# Patient Record
Sex: Female | Born: 1960 | Race: White | Hispanic: No | Marital: Married | Smoking: Never smoker
Health system: Southern US, Community
[De-identification: ages and names within clinical notes are randomized; demographics above are authoritative.]

## PROBLEM LIST (undated history)

## (undated) DIAGNOSIS — R569 Unspecified convulsions: Secondary | ICD-10-CM

## (undated) DIAGNOSIS — F329 Major depressive disorder, single episode, unspecified: Secondary | ICD-10-CM

## (undated) DIAGNOSIS — F32A Depression, unspecified: Secondary | ICD-10-CM

## (undated) DIAGNOSIS — E7800 Pure hypercholesterolemia, unspecified: Secondary | ICD-10-CM

## (undated) DIAGNOSIS — D649 Anemia, unspecified: Secondary | ICD-10-CM

## (undated) DIAGNOSIS — F419 Anxiety disorder, unspecified: Secondary | ICD-10-CM

## (undated) DIAGNOSIS — M1990 Unspecified osteoarthritis, unspecified site: Secondary | ICD-10-CM

## (undated) HISTORY — DX: Depression, unspecified: F32.A

## (undated) HISTORY — DX: Pure hypercholesterolemia, unspecified: E78.00

## (undated) HISTORY — DX: Unspecified osteoarthritis, unspecified site: M19.90

## (undated) HISTORY — DX: Anxiety disorder, unspecified: F41.9

## (undated) HISTORY — DX: Anemia, unspecified: D64.9

## (undated) HISTORY — DX: Major depressive disorder, single episode, unspecified: F32.9

## (undated) HISTORY — DX: Unspecified convulsions: R56.9

## (undated) HISTORY — PX: DILATION AND CURETTAGE OF UTERUS: SHX78

---

## 1999-06-18 ENCOUNTER — Other Ambulatory Visit: Admission: RE | Admit: 1999-06-18 | Discharge: 1999-06-18 | Payer: Self-pay | Admitting: *Deleted

## 1999-08-06 ENCOUNTER — Ambulatory Visit (HOSPITAL_COMMUNITY): Admission: RE | Admit: 1999-08-06 | Discharge: 1999-08-06 | Payer: Self-pay | Admitting: Obstetrics and Gynecology

## 1999-08-06 ENCOUNTER — Encounter: Payer: Self-pay | Admitting: Obstetrics and Gynecology

## 1999-09-10 ENCOUNTER — Ambulatory Visit (HOSPITAL_COMMUNITY): Admission: RE | Admit: 1999-09-10 | Discharge: 1999-09-10 | Payer: Self-pay | Admitting: Obstetrics and Gynecology

## 1999-09-10 ENCOUNTER — Encounter: Payer: Self-pay | Admitting: Obstetrics and Gynecology

## 2000-01-12 ENCOUNTER — Inpatient Hospital Stay (HOSPITAL_COMMUNITY): Admission: AD | Admit: 2000-01-12 | Discharge: 2000-01-14 | Payer: Self-pay | Admitting: Obstetrics and Gynecology

## 2002-07-11 ENCOUNTER — Other Ambulatory Visit: Admission: RE | Admit: 2002-07-11 | Discharge: 2002-07-11 | Payer: Self-pay | Admitting: Family Medicine

## 2003-10-31 ENCOUNTER — Other Ambulatory Visit: Admission: RE | Admit: 2003-10-31 | Discharge: 2003-10-31 | Payer: Self-pay | Admitting: Family Medicine

## 2004-08-06 ENCOUNTER — Ambulatory Visit: Payer: Self-pay | Admitting: Family Medicine

## 2004-10-19 ENCOUNTER — Ambulatory Visit: Payer: Self-pay | Admitting: Family Medicine

## 2004-11-03 ENCOUNTER — Encounter: Admission: RE | Admit: 2004-11-03 | Discharge: 2004-11-03 | Payer: Self-pay | Admitting: Family Medicine

## 2004-11-22 ENCOUNTER — Encounter: Admission: RE | Admit: 2004-11-22 | Discharge: 2004-11-22 | Payer: Self-pay | Admitting: Family Medicine

## 2004-12-24 ENCOUNTER — Ambulatory Visit: Payer: Self-pay | Admitting: Family Medicine

## 2006-01-18 ENCOUNTER — Encounter: Admission: RE | Admit: 2006-01-18 | Discharge: 2006-01-18 | Payer: Self-pay | Admitting: Family Medicine

## 2006-06-28 ENCOUNTER — Other Ambulatory Visit: Admission: RE | Admit: 2006-06-28 | Discharge: 2006-06-28 | Payer: Self-pay | Admitting: Family Medicine

## 2006-06-28 ENCOUNTER — Encounter: Payer: Self-pay | Admitting: Family Medicine

## 2006-06-28 ENCOUNTER — Ambulatory Visit: Payer: Self-pay | Admitting: Family Medicine

## 2007-02-28 ENCOUNTER — Encounter: Payer: Self-pay | Admitting: Obstetrics & Gynecology

## 2007-02-28 ENCOUNTER — Ambulatory Visit: Payer: Self-pay | Admitting: Obstetrics & Gynecology

## 2007-03-12 ENCOUNTER — Ambulatory Visit: Payer: Self-pay | Admitting: Obstetrics & Gynecology

## 2007-03-16 ENCOUNTER — Encounter: Admission: RE | Admit: 2007-03-16 | Discharge: 2007-03-16 | Payer: Self-pay | Admitting: Obstetrics & Gynecology

## 2007-04-04 ENCOUNTER — Ambulatory Visit: Payer: Self-pay | Admitting: Obstetrics & Gynecology

## 2007-04-27 ENCOUNTER — Ambulatory Visit: Payer: Self-pay | Admitting: Family Medicine

## 2007-04-27 DIAGNOSIS — F3289 Other specified depressive episodes: Secondary | ICD-10-CM | POA: Insufficient documentation

## 2007-04-27 DIAGNOSIS — F909 Attention-deficit hyperactivity disorder, unspecified type: Secondary | ICD-10-CM | POA: Insufficient documentation

## 2007-04-27 DIAGNOSIS — E785 Hyperlipidemia, unspecified: Secondary | ICD-10-CM

## 2007-04-27 DIAGNOSIS — N6019 Diffuse cystic mastopathy of unspecified breast: Secondary | ICD-10-CM

## 2007-04-27 DIAGNOSIS — F329 Major depressive disorder, single episode, unspecified: Secondary | ICD-10-CM | POA: Insufficient documentation

## 2007-04-28 ENCOUNTER — Encounter: Payer: Self-pay | Admitting: Family Medicine

## 2007-04-29 LAB — CONVERTED CEMR LAB: Chlamydia, DNA Probe: NEGATIVE

## 2007-04-30 LAB — CONVERTED CEMR LAB

## 2007-10-03 ENCOUNTER — Ambulatory Visit: Payer: Self-pay | Admitting: Gynecology

## 2007-10-09 ENCOUNTER — Ambulatory Visit: Payer: Self-pay | Admitting: Obstetrics & Gynecology

## 2007-10-25 ENCOUNTER — Ambulatory Visit: Payer: Self-pay | Admitting: Obstetrics & Gynecology

## 2008-01-03 ENCOUNTER — Ambulatory Visit: Payer: Self-pay | Admitting: Family Medicine

## 2008-01-04 LAB — CONVERTED CEMR LAB: ALT: 35 units/L (ref 0–35)

## 2008-04-28 ENCOUNTER — Encounter: Admission: RE | Admit: 2008-04-28 | Discharge: 2008-04-28 | Payer: Self-pay | Admitting: Family Medicine

## 2008-05-17 ENCOUNTER — Encounter: Payer: Self-pay | Admitting: Family Medicine

## 2008-06-03 ENCOUNTER — Ambulatory Visit: Payer: Self-pay | Admitting: Obstetrics & Gynecology

## 2008-06-03 ENCOUNTER — Encounter: Payer: Self-pay | Admitting: Obstetrics & Gynecology

## 2008-06-09 ENCOUNTER — Encounter: Admission: RE | Admit: 2008-06-09 | Discharge: 2008-06-09 | Payer: Self-pay | Admitting: Family Medicine

## 2008-06-24 ENCOUNTER — Encounter: Payer: Self-pay | Admitting: Family Medicine

## 2008-07-09 ENCOUNTER — Ambulatory Visit: Payer: Self-pay | Admitting: Family Medicine

## 2008-07-24 ENCOUNTER — Ambulatory Visit: Payer: Self-pay | Admitting: Obstetrics & Gynecology

## 2008-09-05 DIAGNOSIS — R569 Unspecified convulsions: Secondary | ICD-10-CM

## 2008-09-05 HISTORY — DX: Unspecified convulsions: R56.9

## 2008-11-12 ENCOUNTER — Ambulatory Visit: Payer: Self-pay | Admitting: Family Medicine

## 2008-11-12 DIAGNOSIS — R5381 Other malaise: Secondary | ICD-10-CM | POA: Insufficient documentation

## 2008-11-12 DIAGNOSIS — R5383 Other fatigue: Secondary | ICD-10-CM

## 2009-01-06 ENCOUNTER — Ambulatory Visit: Payer: Self-pay | Admitting: Family Medicine

## 2009-01-07 LAB — CONVERTED CEMR LAB
ALT: 28 units/L (ref 0–35)
Cholesterol: 235 mg/dL — ABNORMAL HIGH (ref 0–200)
Direct LDL: 161.9 mg/dL
VLDL: 11.2 mg/dL (ref 0.0–40.0)

## 2009-01-28 ENCOUNTER — Encounter: Payer: Self-pay | Admitting: Family Medicine

## 2009-05-13 ENCOUNTER — Ambulatory Visit: Payer: Self-pay | Admitting: Obstetrics & Gynecology

## 2009-05-13 ENCOUNTER — Encounter: Payer: Self-pay | Admitting: Obstetrics & Gynecology

## 2009-05-13 LAB — CONVERTED CEMR LAB

## 2009-05-14 ENCOUNTER — Encounter: Payer: Self-pay | Admitting: Obstetrics & Gynecology

## 2009-05-14 LAB — CONVERTED CEMR LAB: Clue Cells Wet Prep HPF POC: NONE SEEN

## 2009-05-22 ENCOUNTER — Ambulatory Visit (HOSPITAL_COMMUNITY): Admission: RE | Admit: 2009-05-22 | Discharge: 2009-05-22 | Payer: Self-pay | Admitting: Obstetrics & Gynecology

## 2009-05-29 ENCOUNTER — Ambulatory Visit (HOSPITAL_COMMUNITY): Admission: RE | Admit: 2009-05-29 | Discharge: 2009-05-29 | Payer: Self-pay | Admitting: Family Medicine

## 2009-08-11 ENCOUNTER — Ambulatory Visit: Payer: Self-pay | Admitting: Obstetrics & Gynecology

## 2009-08-11 ENCOUNTER — Encounter: Payer: Self-pay | Admitting: Family Medicine

## 2009-08-12 ENCOUNTER — Encounter: Payer: Self-pay | Admitting: Obstetrics & Gynecology

## 2009-09-03 ENCOUNTER — Ambulatory Visit: Payer: Self-pay | Admitting: Obstetrics & Gynecology

## 2009-10-05 ENCOUNTER — Ambulatory Visit: Payer: Self-pay | Admitting: Obstetrics and Gynecology

## 2009-10-06 ENCOUNTER — Encounter: Payer: Self-pay | Admitting: Obstetrics & Gynecology

## 2009-10-27 ENCOUNTER — Telehealth: Payer: Self-pay | Admitting: Family Medicine

## 2009-10-30 ENCOUNTER — Ambulatory Visit: Payer: Self-pay | Admitting: Family Medicine

## 2009-10-31 ENCOUNTER — Encounter: Payer: Self-pay | Admitting: Family Medicine

## 2009-11-02 LAB — CONVERTED CEMR LAB
ALT: 48 units/L — ABNORMAL HIGH (ref 0–35)
AST: 35 units/L (ref 0–37)
Albumin: 4 g/dL (ref 3.5–5.2)
Alkaline Phosphatase: 61 units/L (ref 39–117)
CO2: 27 meq/L (ref 19–32)
Calcium: 9.6 mg/dL (ref 8.4–10.5)
Chloride: 106 meq/L (ref 96–112)
Cholesterol: 247 mg/dL — ABNORMAL HIGH (ref 0–200)
Creatinine, Ser: 0.9 mg/dL (ref 0.4–1.2)
Direct LDL: 172.5 mg/dL
Eosinophils Absolute: 0.2 10*3/uL (ref 0.0–0.7)
Eosinophils Relative: 4 % (ref 0.0–5.0)
GFR calc non Af Amer: 71.28 mL/min (ref >60)
Glucose, Bld: 92 mg/dL (ref 70–99)
Monocytes Relative: 10.7 % (ref 3.0–12.0)
Neutrophils Relative %: 49.9 % (ref 43.0–77.0)
Potassium: 4 meq/L (ref 3.5–5.1)
RBC: 4.06 M/uL (ref 3.87–5.11)
Sodium: 137 meq/L (ref 135–145)
TSH: 3.02 microintl units/mL (ref 0.35–5.50)
Total Protein: 7.2 g/dL (ref 6.0–8.3)
VLDL: 15.4 mg/dL (ref 0.0–40.0)
Vit D, 25-Hydroxy: 45 ng/mL (ref 30–89)
WBC: 4.7 10*3/uL (ref 4.5–10.5)

## 2009-11-03 ENCOUNTER — Ambulatory Visit: Payer: Self-pay | Admitting: Obstetrics and Gynecology

## 2009-11-27 ENCOUNTER — Encounter: Payer: Self-pay | Admitting: Family Medicine

## 2009-12-31 ENCOUNTER — Ambulatory Visit: Payer: Self-pay | Admitting: Family Medicine

## 2009-12-31 DIAGNOSIS — T2200XA Burn of unspecified degree of shoulder and upper limb, except wrist and hand, unspecified site, initial encounter: Secondary | ICD-10-CM | POA: Insufficient documentation

## 2010-02-16 ENCOUNTER — Ambulatory Visit: Payer: Self-pay | Admitting: Family Medicine

## 2010-02-17 LAB — CONVERTED CEMR LAB
AST: 31 units/L (ref 0–37)
Cholesterol: 172 mg/dL (ref 0–200)
HDL: 58.7 mg/dL (ref >39.00)
LDL Cholesterol: 103 mg/dL — ABNORMAL HIGH (ref 0–99)
Triglycerides: 52 mg/dL (ref 0.0–149.0)
VLDL: 10.4 mg/dL (ref 0.0–40.0)

## 2010-05-26 ENCOUNTER — Encounter (INDEPENDENT_AMBULATORY_CARE_PROVIDER_SITE_OTHER): Payer: Self-pay | Admitting: *Deleted

## 2010-06-09 ENCOUNTER — Ambulatory Visit: Payer: Self-pay | Admitting: Family Medicine

## 2010-06-09 ENCOUNTER — Encounter: Payer: Self-pay | Admitting: Family Medicine

## 2010-06-09 ENCOUNTER — Telehealth: Payer: Self-pay | Admitting: Family Medicine

## 2010-06-11 LAB — CONVERTED CEMR LAB
ALT: 19 units/L (ref 0–35)
AST: 23 units/L (ref 0–37)
Cholesterol: 179 mg/dL (ref 0–200)
VLDL: 12.2 mg/dL (ref 0.0–40.0)

## 2010-08-31 ENCOUNTER — Ambulatory Visit
Admission: RE | Admit: 2010-08-31 | Discharge: 2010-08-31 | Payer: Self-pay | Source: Home / Self Care | Attending: Obstetrics & Gynecology | Admitting: Obstetrics & Gynecology

## 2010-09-07 ENCOUNTER — Ambulatory Visit
Admission: RE | Admit: 2010-09-07 | Discharge: 2010-09-07 | Payer: Self-pay | Source: Home / Self Care | Attending: Family Medicine | Admitting: Family Medicine

## 2010-09-08 ENCOUNTER — Encounter: Payer: Self-pay | Admitting: Obstetrics & Gynecology

## 2010-10-05 NOTE — Miscellaneous (Signed)
Summary: PPD  Clinical Lists Changes  Orders: Added new Service order of TB Skin Test [phone]) - Signed Added new Service order of Admin 1st Vaccine (60454) - Signed Observations: Added new observation of TB PPDRESULT: negative (06/09/2010 9:38) Added new observation of PPD RESULT: < 5mm (06/09/2010 9:38) Added new observation of TB-PPD RDDTE: 06/11/2010 (06/09/2010 9:38) Added new observation of TB-PPD LOT#: [id number] (06/09/2010 9:38) Added new observation of TB-PPD EXP: 07/08/2011 (06/09/2010 9:38) Added new observation of TB-PPD BY: Lowella Petties CMA (06/09/2010 9:38) Added new observation of TB-PPD RTE: ID (06/09/2010 9:38) Added new observation of TB-PPD DSE: 0.1 ml (06/09/2010 9:38) Added new observation of TB-PPD MFR: Sanofi Pasteur (06/09/2010 9:38) Added new observation of TB-PPD SITE: right forearm (06/09/2010 9:38) Added new observation of TB-PPD: PPD (06/09/2010 9:38)      Immunizations Administered:  PPD Skin Test:    Vaccine Type: PPD    Site: right forearm    Mfr: Sanofi Pasteur    Dose: 0.1 ml    Route: ID    Given by: Lowella Petties CMA    Exp. Date: 07/08/2011    Lot #: [id number]  PPD Results    Date of reading: 06/11/2010    Results: < 5mm    Interpretation: negative The patient presented after 48 hours to check the injection site for positive or negative reaction. Injection site esamination: No firm bump forms at the test site. Slightly reddish appearance and diameter was smaller than 5mm. Assessment & Plan: Negative TB skin test. Patient was counseled to callif experience any irritation of site.  Nurse signature Lewanda Rife LPN  June 11, 2010 11:32 AM   Physician signature

## 2010-10-05 NOTE — Progress Notes (Signed)
Summary: pt wants labs prior to physical  Phone Note Call from Patient Call back at Home Phone [phone]   Caller: Patient Call For: Judith Part MD Summary of Call: Pt. has a physical scheduled for april but she is asking if she can come in prior to that for labs.  Please advise. Initial call taken by: Lowella Petties CMA,  October 27, 2009 3:32 PM  Follow-up for Phone Call        I would love to do that fasting labs for lipid/ wellness and vit D please v70.0, 272 - thanks Follow-up by: Judith Part MD,  October 27, 2009 4:10 PM  Additional Follow-up for Phone Call Additional follow up Details #1::        Pt request lab to be done soon. Fasting lab appointment scheduled as instructed 10/30/09 at 8:45am.[facility] LPN  October 27, 2009 4:42 PM
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
## 2010-10-05 NOTE — Letter (Signed)
Summary: [place] No Show Letter  [place] at [facility]  [address] [place], [place] [address]   Phone: [phone]  Fax: [phone]    05/26/2010 MRN: [id number]  Alison Kelley [address] [place], [place]  [address]   Dear Ms. Vanosdol,   Our records indicate that you missed your scheduled appointment with _____lab________________ on __9.21.11__________.  Please contact this office to reschedule your appointment as soon as possible.  It is important that you keep your scheduled appointments with your physician, so we can provide you the best care possible.  Please be advised that there may be a charge for "no show" appointments.    Sincerely,   [place] at [facility]

## 2010-10-05 NOTE — Consult Note (Signed)
Summary: [facility]  [facility]   Imported By: Lanelle Bal 12/05/2009 09:33:40  _____________________________________________________________________  External Attachment:    Type:   Image     Comment:   External Document

## 2010-10-05 NOTE — Progress Notes (Signed)
Summary: form for employment  Phone Note Call from Patient   Caller: Patient Call For: Judith Part MD Summary of Call: Pt. has dropped off form for employment to be completed.  Form is on your shelf. 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 and there are no restrictions with teaching or driving due to depression or seizures. Contact # for pt when form is ready [phone]. Lewanda Rife LPN  June 11, 2010 11:30 AM     Additional Follow-up for Phone Call Additional follow up Details #2::    form done and in nurse in box  Follow-up by: Judith Part MD,  June 11, 2010 11:41 AM  Additional Follow-up for Phone Call Additional follow up Details #3:: Details for Additional Follow-up Action Taken: Patient notified as instructed by telephone. Pt said she would pick up Mon afternoon. form copied and sent for scanning. form at the front desk for pick up.Lewanda Rife LPN  June 11, 2010 3:14 PM

## 2010-11-22 ENCOUNTER — Other Ambulatory Visit: Payer: Self-pay

## 2010-11-22 DIAGNOSIS — N951 Menopausal and female climacteric states: Secondary | ICD-10-CM

## 2010-12-23 ENCOUNTER — Ambulatory Visit: Payer: Self-pay | Admitting: Obstetrics & Gynecology
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
## 2011-01-18 NOTE — Assessment & Plan Note (Signed)
NAME:  Alison Kelley, Alison Kelley            ACCOUNT NO.:  [id number]   MEDICAL RECORD NO.:  [id number]          PATIENT TYPE:  POB   LOCATION:  [facility]         FACILITY:  [facility]   PHYSICIAN:  Catalina Antigua, MD     DATE OF BIRTH:  11-07-1960   DATE OF SERVICE:  11/03/2009                                  CLINIC NOTE   This is a 50 year old gravida 2, para 2 who presents today for annual  exam.  The patient is currently without any complaints.  Denies any  abnormal bleeding or discharge.  The patient is in a monogamous  relation, is using Mirena IUD for birth control.  The patient also  reports having 2 urinary tract infections in the fall, one of which  caused her to have high fevers resulting in a febrile seizure.  The  patient had a full workup by a neurologist and that workup was found to  be negative.   PAST MEDICAL HISTORY:  Significant for depression, uterine fibroids, and  vitiligo.   PAST SURGICAL HISTORY:  She has had a D&C and C-section.   PAST GYNECOLOGICAL HISTORY:  She has had an abnormal Pap smear in the  past followed by colposcopy and has been normal since.   PAST OBSTETRICAL HISTORY:  She has had a C-section.   FAMILY HISTORY:  Significant for a grandfather with diabetes and a  grandmother with heart disease.  Her mother has high blood pressure and  was diagnosed which uterine cancer.  She has a sister who was diagnosed  with breast cancer at the age of 67 and her father is deceased from  Hodgkin's disease.   SOCIAL HISTORY:  She denies drinking, smoking, or the use of illicit  drugs.   PHYSICAL EXAMINATION:  VITAL SIGNS:  Her blood pressure is 87/57, pulse  of 65, weight of 151 pounds, height of 5 feet 7 inches.  LUNGS:  Clear to auscultation bilaterally.  HEART:  Regular rate and rhythm.  BREASTS:  Equal in size.  No skin dimpling.  No expressible nipple  discharge.  No palpable masses or lymphadenopathy.  ABDOMEN:  Soft, nontender, nondistended.  GENITALIA:  Normal vaginal mucosa and normal cervix.  IUD strings were  visualized at the external os.  No abnormal bleeding or discharge.  Bimanual exam shows small anteverted uterus.  No palpable adnexal masses  or tenderness.   ASSESSMENT/PLAN:  A 50 year old gravida 2, para 2 who is here for her  annual exam.  The patient is currently without complaints.  Pap smear  was performed.  The patient was provided with a referral for mammogram  in the  fall.  The patient will be contacted with any abnormal results.  The  patient is to return in a year or p.r.n.  Her IUD is due to be removed  in 2013.           ______________________________  Catalina Antigua, MD     PC/MEDQ  D:  11/03/2009  T:  11/04/2009  Job:  [id number]

## 2011-01-18 NOTE — Assessment & Plan Note (Signed)
NAME:  Alison Kelley, Alison Kelley NO.:  [id number]   MEDICAL RECORD NO.:  [id number]          PATIENT TYPE:  POB   LOCATION:  [facility]         FACILITY:  [facility]   PHYSICIAN:  Allie Bossier, MD        DATE OF BIRTH:  02-20-1961   DATE OF SERVICE:  05/13/2009                                  CLINIC NOTE   Alison Kelley is a 50 year old divorced white gravida 2, para 2, who comes in  here for her annual exam.  She was actually due for a followup Pap smear  4 months ago due to her history of low-grade dysplasia, however, she has  just made it today.  She complains of recurrent yeast infections these  have never been diagnosed microscopically these are self-diagnosis.  She  says that her partner occasionally gets an itch on his penis and that  they both use a yeast cream and it seems to make it better.   PAST MEDICAL HISTORY:  Depression, fibroids, vitiligo, and history of  low-grade dysplasia.   REVIEW OF SYSTEMS:  She is now divorced and consider in getting married  next year.  She is a Optician, dispensing.  She has been monogamous for the last 2  years.  The remainder of review of systems questions are negative.   PREVIOUS SURGERIES:  She has had a D and C for a spontaneous AB and a C-  section in 1997.  She had a VBAC following that.   ALLERGIES:  No known drug allergies.  No to LATEX allergies.   SOCIAL HISTORY:  She drinks alcohol rarely.   FAMILY HISTORY:  Her sister was diagnosed with breast cancer at age 87.  Her mother had uterine cancer diagnosed at age 29.  Her father has  Hodgkin's disease.  There is no family history of colon cancer.  She has a Mirena IUD in place.   MEDICATIONS:  1. She takes vitamin D.  2. Pristiq daily.  3. Prozac 80 mg daily.  4. Fish oil daily.  5. Multivitamin daily.  6. Excedrin as necessary.   PHYSICAL EXAMINATION:  VITAL SIGNS:  Height 5 feet 7 inches, weight 144,  blood pressure 103/73, pulse 66.  HEENT:  Normal breast exam normal  bilaterally.  HEART:  Regular rate and rhythm.  LUNGS:  Clear to auscultation bilaterally.  ABDOMEN:  Benign scaphoid.  EXTERNAL GENITALIA:  No lesions.  Cervix appears normal, discharge  appears normal as well, but I did do a wet prep and a Pap smear.   ASSESSMENT AND PLAN:  1. Annual exam.  I have checked a wet prep and scheduled a mammogram.      I recommended self-breast exams monthly.  2. Vaginal irritation.  The patient and her partner has genital      irritation.  I have checked a wet prep and I did an HSV-II, IgG,      blood test, and per request, I have also checked HIV, GC, and      Chlamydia.  We will treat these as indicated.      Allie Bossier, MD     MCD/MEDQ  D:  05/13/2009  T:  05/13/2009  Job:  [id number]

## 2011-01-21 NOTE — Op Note (Signed)
[facility]  Patient:    Alison Kelley, Alison Kelley                   MRN: [id number] Proc. Date: 01/12/00 Adm. Date:  [id number] Attending:  Dierdre Forth Pearline                           Operative Report  PREOPERATIVE DIAGNOSIS:       Intrauterine pregnancy at term.  Prior cesarean section with desire for a trial of labor.  Meconium stained fluid.  Persistent prolonged variable decellerations during the second stage.  POSTOPERATIVE DIAGNOSIS:      Intrauterine pregnancy at term.  Prior cesarean section with desire for a trial of labor.  Meconium stained fluid.  Persistent prolonged variable decellerations during the second stage.  Plus nuchal cord x .  OPERATION:                    Vacuum-assisted vaginal delivery, midline episiotomy with repair.  SURGEON:                      Vanessa P. Haygood, M.D.  ASSISTANT:  ANESTHESIA:                   Epidural.  ESTIMATED BLOOD LOSS:         500 cc.  COMPLICATIONS:                None.  FINDINGS:                     The patient was delivered of a female infant whose ame is Alison Kelley with Apgars of 9 and 9 at one and five minutes, respectively.  The  weight was pending at the time of dictation.  DESCRIPTION OF PROCEDURE:     The patient had been complete for approximately an hour and pushing.  With each episode of pushing, the patient had deep variable decellerations.  After approximately 45 to 50 minutes, the variable decellerations became prolonged lasting as much as three to four minutes.  The nadir of the decellerations was as low as 50 to 60 beats per minute.  The patient had advanced the vertex to a +2 to +3 station.  A discussion was held with the parents concerning the changes in the fetal heart rate and the option of vacuum-assisted vaginal delivery recommended.  The risks were explained to the parents and they  wished to proceed.  The patient was in the lithotomy position and the  perineum as prepped and draped.  The bladder was emptied with a red Robinson catheter.  The  bell-shaped Mity-Vac vacuum extractor was placed on the fetal vertex and over the next two contractions, the vertex was advanced to the introitus.  At that point, two popoffs occurred and the mushroom-shaped Mity-Vac was used to allow delivery over a midline second degree episiotomy with the next contraction for a total of three contractions.  The nares and pharynx were suctioned with DeLee suction and the three loops of nuchal cord reduced.  The remainder of the infant was delivered with a combination of gentle traction and maternal expulsive efforts.  The cord was clamped and cut and the infant handed off to the pediatrician who was in attendance.  The appropriate cord blood was drawn and the perineum inspected to  reveal no extension of the episiotomy.  The episiotomy was repaired  in a layered fashion with a suture of 3-0 Vicryl.  At that point, the placenta showed signs f detachment and was removed with gentle traction.  The cervix was then inspected and found to be free of lacerations.  An ice pack was placed on the perineum for postpartum care.  The infant remained in the room for bonding with mother with anticipated admission to the fullterm nursery. DD:  01/13/00 TD:  01/13/00 Job: [id number]

## 2011-01-21 NOTE — Discharge Summary (Signed)
[facility]  Patient:    Alison Kelley, Alison Kelley                   MRN: [id number] Adm. Date:  [id number] Disc. Date: [id number] Attending:  Shaune Spittle Dictator:   Miguel Dibble, C.N.M.                           Discharge Summary  ADMISSION DIAGNOSES:          1. Intrauterine pregnancy at 40 weeks.                               2. Advanced maternal age.                               3. Declined amniocentesis.                               4. Spontaneous rupture of membranes with light                                  meconium stained fluid.                               5. Early active labor.                               6. Previous cesarean section desiring trial of                                  labor.                               7. Multiparous.  DISCHARGE DIAGNOSES:          1. Intrauterine pregnancy at 40 weeks.                               2. Advanced maternal age.                               3. Declined amniocentesis.                               4. Spontaneous rupture of membranes with light                                  meconium stained fluid.                               5. Early active labor.                               6. Previous cesarean section desiring  trial of                                  labor.                               7. Multiparous.                               8. Delivered by vacuum extraction, a viable babyboy                                  weighing 7 pounds 5 ounces, Apgars 9 and 9.                               9. Nuchal cord x 3.                              10. Persistent prolonged variable decellerations.  PROCEDURE:                    Vacuum extraction, midline episiotomy, internal fetal monitoring, amnioinfusion.  Epidural anesthesia.  HOSPITAL COURSE:              On May 9, at approximately 1700 hours, [name] was admitted 3 cm dilated, 60 to 70% effaced, vertex at -2,  rupture of membranes with light meconium stained fluid.  By 1930 hours, she was contracting every two to three minutes.  Fetal heart rate was reactive and reassuring.  Her  cervix check was deferred at the patients request.  Discussion was held at length with the patient regarding risk of early epidural and the decision was made to proceed with an epidural and to recheck her cervix and place in an IUPC if necessary at 8 p.m.  IUPC was placed and FSE was placed.  Her cervix progressed to 6 to 7 cm, completely effaced, and vertex at 0 to +1 station.  She had a deep variable for two contractions down to the 70s which resolved at the end of the contraction with position changes and oxygen.  Her blood pressures were within normal limits.  This was following her epidural anesthesia.  At 2115 her cervix was 9 cm with an anterior lip, complete, and vertex at 0 station with some show. Her contractions were every two to four minutes.  Her amnioinfusion was progressing. By 2300 hours, fetal heart rate was 120s to 130s baseline with satisfactory short term variability, however, moderate variable decellerations to 50 or 60 with contractions x 60 to 90 seconds, recovery to the baseline between contractions.  She had been pushing for half an hour.  The decision was made to proceed with a  vacuum extraction delivery.  She delivered a viable babyboy, [name], weighing 7 pounds 5 ounces, Apgars 9 and 9, who was transferred to the regular nursery in stable condition.  On postpartum day #1, at approximately 5:20, the patient described an episode of syncope on her way to the bathroom, however, she recovered well and blood pressures were stable.  On postpartum day #1, at about 11 a.m. her hemoglobin was 10.5, hematocrit 30.1, white count 18.1,  platelets 202.  Lochia as small.  She was coping well with her self and infant care.  Her perineum was healing satisfactorily.  On postpartum day #2, on May  11, she was complaining of some numbness and tingling in her left leg, however, she was weightbearing satisfactorily and able to conduct her self and infant care satisfactorily. She was seen by the anesthesia department who would follow up with her should she have any lingering or worsening symptoms over the next weeks.  She was discharged home in stable condition.  DISCHARGE INSTRUCTIONS:       Per [facility] Gynecology booklet.  DISCHARGE MEDICATIONS:        1. Motrin.                               2. Prenatal vitamins.  She would decide on contraception at her postpartum visit.  FOLLOW-UP:                    In six weeks at [facility] Gy[facility]. DD:  01/14/00 TD:  01/17/00 Job: [id number]

## 2011-02-07 ENCOUNTER — Other Ambulatory Visit: Payer: Self-pay | Admitting: *Deleted

## 2011-02-07 MED ORDER — SIMVASTATIN 20 MG PO TABS: 20.0000 mg | ORAL_TABLET | Freq: Every day | ORAL | Status: DC

## 2011-06-21 LAB — POCT PREGNANCY, URINE: Operator id: 148111

## 2012-01-19 ENCOUNTER — Ambulatory Visit (INDEPENDENT_AMBULATORY_CARE_PROVIDER_SITE_OTHER): Payer: Commercial Managed Care - PPO | Admitting: Family Medicine

## 2012-01-19 VITALS — BP 92/58 | HR 75 | Temp 98.7°F | Resp 16 | Ht 66.25 in | Wt 148.6 lb

## 2012-01-19 DIAGNOSIS — E785 Hyperlipidemia, unspecified: Secondary | ICD-10-CM

## 2012-01-19 DIAGNOSIS — R599 Enlarged lymph nodes, unspecified: Secondary | ICD-10-CM

## 2012-01-19 MED ORDER — SIMVASTATIN 20 MG PO TABS: 20.0000 mg | ORAL_TABLET | Freq: Every day | ORAL | Status: DC

## 2012-01-19 NOTE — Progress Notes (Addendum)
S:  51 yo woman with request for refill on simvastatin 20 mg.  Her last cholesterol was "good" in September.  She will send [place] a copy.  Also, has noted lump in posterior left axilla.  (Father had Hodgkin's disease)  Onset: 7 days ago.  O:  1x 1.5 cm freely mobile soft elliptical sub q left posterior axilla nodule c/w lymph node.  Breast exam: normal No adenopathy in neck, supraclavicular or axillary areas otherwise Spleen and liver not palpable.  A:  Hyperlipidema, new nodule left axilla  P:  CMET, CBC Mammogram and possibly U/S Send lipid panel Refill the simvastatin  Patient also mentioned that she might like gluten testing, although she is not having any problems with digestion or abdomen at present.  I suggested this can be done but most likely will be normal.  We did not pursue testing.

## 2012-01-20 LAB — CBC WITH DIFFERENTIAL/PLATELET
Basophils Absolute: 0 10*3/uL (ref 0.0–0.1)
Basophils Relative: 1 % (ref 0–1)
Eosinophils Absolute: 0.2 10*3/uL (ref 0.0–0.7)
Eosinophils Relative: 3 % (ref 0–5)
HCT: 39 % (ref 36.0–46.0)
Hemoglobin: 13.3 g/dL (ref 12.0–15.0)
Lymphocytes Relative: 36 % (ref 12–46)
Lymphs Abs: 2.2 10*3/uL (ref 0.7–4.0)
MCH: 31.2 pg (ref 26.0–34.0)
MCHC: 34.1 g/dL (ref 30.0–36.0)
MCV: 91.5 fL (ref 78.0–100.0)
Monocytes Absolute: 0.4 10*3/uL (ref 0.1–1.0)
Monocytes Relative: 6 % (ref 3–12)
Neutro Abs: 3.4 10*3/uL (ref 1.7–7.7)
Neutrophils Relative %: 54 % (ref 43–77)
Platelets: 249 10*3/uL (ref 150–400)
RBC: 4.26 MIL/uL (ref 3.87–5.11)
RDW: 13.8 % (ref 11.5–15.5)
WBC: 6.2 10*3/uL (ref 4.0–10.5)

## 2012-01-20 LAB — COMPREHENSIVE METABOLIC PANEL
ALT: 38 U/L — ABNORMAL HIGH (ref 0–35)
AST: 32 U/L (ref 0–37)
Albumin: 4.6 g/dL (ref 3.5–5.2)
Alkaline Phosphatase: 84 U/L (ref 39–117)
BUN: 16 mg/dL (ref 6–23)
CO2: 30 mEq/L (ref 19–32)
Calcium: 9.4 mg/dL (ref 8.4–10.5)
Chloride: 102 mEq/L (ref 96–112)
Creat: 0.94 mg/dL (ref 0.50–1.10)
Glucose, Bld: 87 mg/dL (ref 70–99)
Potassium: 4.5 mEq/L (ref 3.5–5.3)
Sodium: 140 mEq/L (ref 135–145)
Total Bilirubin: 0.3 mg/dL (ref 0.3–1.2)
Total Protein: 7.1 g/dL (ref 6.0–8.3)

## 2012-01-31 ENCOUNTER — Other Ambulatory Visit: Payer: Self-pay | Admitting: Family Medicine

## 2012-01-31 DIAGNOSIS — C8294 Follicular lymphoma, unspecified, lymph nodes of axilla and upper limb: Secondary | ICD-10-CM

## 2012-02-07 ENCOUNTER — Other Ambulatory Visit: Payer: Self-pay

## 2012-02-10 ENCOUNTER — Other Ambulatory Visit: Payer: Self-pay

## 2012-02-17 ENCOUNTER — Ambulatory Visit
Admission: RE | Admit: 2012-02-17 | Discharge: 2012-02-17 | Disposition: A | Discharge status: Home / Self Care | Payer: Commercial Managed Care - PPO | Source: Ambulatory Visit | Attending: Family Medicine | Admitting: Family Medicine

## 2012-02-17 DIAGNOSIS — C8294 Follicular lymphoma, unspecified, lymph nodes of axilla and upper limb: Secondary | ICD-10-CM

## 2012-05-16 ENCOUNTER — Ambulatory Visit (INDEPENDENT_AMBULATORY_CARE_PROVIDER_SITE_OTHER): Payer: Commercial Managed Care - PPO | Admitting: Obstetrics & Gynecology

## 2012-05-16 ENCOUNTER — Encounter: Payer: Self-pay | Admitting: Obstetrics & Gynecology

## 2012-05-16 VITALS — BP 108/71 | HR 74 | Temp 98.7°F | Ht 66.0 in | Wt 146.4 lb

## 2012-05-16 DIAGNOSIS — Z30433 Encounter for removal and reinsertion of intrauterine contraceptive device: Secondary | ICD-10-CM

## 2012-05-16 DIAGNOSIS — Z975 Presence of (intrauterine) contraceptive device: Secondary | ICD-10-CM

## 2012-05-16 DIAGNOSIS — Z01812 Encounter for preprocedural laboratory examination: Secondary | ICD-10-CM

## 2012-05-16 LAB — POCT PREGNANCY, URINE: Preg Test, Ur: NEGATIVE

## 2012-05-16 MED ORDER — LEVONORGESTREL 20 MCG/24HR IU IUD: 1.0000 | INTRAUTERINE_SYSTEM | Freq: Once | INTRAUTERINE | Status: DC

## 2012-05-16 NOTE — Patient Instructions (Signed)

## 2012-05-16 NOTE — Progress Notes (Signed)
Pt requests removal and replacement of Mirena IUS.  No contraindications.  Question s regarding Mirena and menopause reviewed.  Patient identified, informed consent performed.  Discussed risks of irregular bleeding, cramping, infection, malpositioning or misplacement of the IUD outside the uterus which may require further procedures. Time out was performed.  Urine pregnancy test negative.  Speculum placed in the vagina.  Cervix visualized. IUD strings noted and IUD removed.  Cervix then cleaned with Betadine x 2. Hurricaine spray applied to ant lip of cervix.  Cervix grasped anteriorly with a single tooth tenaculum.  Uterus sounded to 7 cm.  Mirena IUD placed per manufacturer's recommendations.  Strings trimmed to 3 cm. Tenaculum was removed, good hemostasis noted.  Patient tolerated procedure well.   Patient was given post-procedure instructions and the Mirena care card with expiration date.  Patient was also asked to check IUD strings periodically and follow up in 6 weeks for IUD check and annual.  Pt reports that she will try to obtain results of last PAP and brings results with her to her next appt.  Zuleyka Kloc L. Harraway-Smith, M.D., [place]

## 2012-05-16 NOTE — Progress Notes (Signed)
Wants to get IUD removed and a new one inserted. States was put in July , 2008  Here in clinic. Per depression screen denies depression or feeling sad daily, states finds enjoyment in daily life and denies feelings of harming self.

## 2012-06-21 ENCOUNTER — Ambulatory Visit: Payer: Commercial Managed Care - PPO | Admitting: Obstetrics & Gynecology

## 2012-07-12 ENCOUNTER — Ambulatory Visit (INDEPENDENT_AMBULATORY_CARE_PROVIDER_SITE_OTHER): Payer: Commercial Managed Care - PPO | Admitting: Family Medicine

## 2012-07-12 VITALS — BP 100/68 | HR 68 | Temp 97.9°F | Resp 16 | Ht 66.75 in | Wt 146.4 lb

## 2012-07-12 DIAGNOSIS — Z124 Encounter for screening for malignant neoplasm of cervix: Secondary | ICD-10-CM

## 2012-07-12 DIAGNOSIS — E785 Hyperlipidemia, unspecified: Secondary | ICD-10-CM

## 2012-07-12 LAB — COMPREHENSIVE METABOLIC PANEL
AST: 24 U/L (ref 0–37)
Albumin: 4.6 g/dL (ref 3.5–5.2)
BUN: 14 mg/dL (ref 6–23)
Calcium: 9.7 mg/dL (ref 8.4–10.5)
Chloride: 101 mEq/L (ref 96–112)
Glucose, Bld: 84 mg/dL (ref 70–99)
Potassium: 4.1 mEq/L (ref 3.5–5.3)
Sodium: 139 mEq/L (ref 135–145)
Total Protein: 7.2 g/dL (ref 6.0–8.3)

## 2012-07-12 LAB — LIPID PANEL: Cholesterol: 228 mg/dL — ABNORMAL HIGH (ref 0–200)

## 2012-07-12 NOTE — Patient Instructions (Signed)
If problems persist with the IUD string, please contact the GYN.  If your cholesterol report comes back good, then return to see her for repeat

## 2012-07-12 NOTE — Progress Notes (Signed)
  Subjective:    Patient ID: Alison Kelley, female    DOB: 09-Jan-1961, 51 y.o.   MRN: [id number]  HPI    Review of Systems     Objective:   Physical Exam        Assessment & Plan:

## 2012-07-12 NOTE — Progress Notes (Signed)
Subjective: Patient is here for Pap smear. She had an IUD placed earlier this year. Her husband is complaining about feelings the string when he is intercourse. No other major complaints. She has a lipoma in her left axilla that has been examined in the past. She also has a history of high cholesterol that needs to be followed up on.  Objective Pleasant lady in no acute distress. Her depression is getting managed by psychiatrist. Neck supple without nodes or thyromegaly. Chest is clear to auscultation. Heart regular without murmurs. 1.5-2 cm lipoma posterior left axilla abdomen soft without mass or tenderness vaginal exam normal vagina cervix benign in appearance there is a 1-1/2 inch string coming out of the cervix from 9 daily. The independent is crypt a little pointing out into the vagina. I think this is what her husband feels. Neck cut off approximately 1 cm of the string, leaving about a 3-4 cm pale still present. I hope this is enough. If she still has symptoms she is to go back to her gynecologist. Bimanual exam normal with no adnexal masses  Assessment: IUD recheck GYN exam Hyperlipidemia Lipoma left axilla  Plan: Reassurance, return in one year. Check lipids and CMet   If the axillary lipoma changes she is to get rechecked. She had her mammogram and breast exam earlier in the year.

## 2012-07-17 LAB — PAP IG, CT-NG, RFX HPV ASCU: GC Probe Amp: NEGATIVE

## 2012-12-11 ENCOUNTER — Other Ambulatory Visit: Payer: Self-pay | Admitting: Family Medicine

## 2013-10-19 ENCOUNTER — Ambulatory Visit (INDEPENDENT_AMBULATORY_CARE_PROVIDER_SITE_OTHER): Payer: Commercial Managed Care - PPO | Admitting: Family Medicine

## 2013-10-19 VITALS — BP 104/60 | HR 61 | Temp 98.0°F | Ht 67.0 in | Wt 136.2 lb

## 2013-10-19 DIAGNOSIS — N952 Postmenopausal atrophic vaginitis: Secondary | ICD-10-CM

## 2013-10-19 DIAGNOSIS — L723 Sebaceous cyst: Secondary | ICD-10-CM

## 2013-10-19 DIAGNOSIS — Z0000 Encounter for general adult medical examination without abnormal findings: Secondary | ICD-10-CM

## 2013-10-19 DIAGNOSIS — J310 Chronic rhinitis: Secondary | ICD-10-CM

## 2013-10-19 DIAGNOSIS — L989 Disorder of the skin and subcutaneous tissue, unspecified: Secondary | ICD-10-CM

## 2013-10-19 LAB — LIPID PANEL
CHOL/HDL RATIO: 4.4 ratio
CHOLESTEROL: 264 mg/dL — AB (ref 0–200)
HDL: 60 mg/dL (ref >39)
LDL Cholesterol: 191 mg/dL — ABNORMAL HIGH (ref 0–99)
Triglycerides: 64 mg/dL (ref <150)
VLDL: 13 mg/dL (ref 0–40)

## 2013-10-19 LAB — POCT CBC
GRANULOCYTE PERCENT: 57.3 % (ref 37–80)
HEMATOCRIT: 39.8 % (ref 37.7–47.9)
HEMOGLOBIN: 12.5 g/dL (ref 12.2–16.2)
LYMPH, POC: 1.3 (ref 0.6–3.4)
MCH, POC: 29.8 pg (ref 27–31.2)
MCHC: 31.4 g/dL — AB (ref 31.8–35.4)
MCV: 95 fL (ref 80–97)
MID (cbc): 0.4 (ref 0–0.9)
MPV: 9.1 fL (ref 0–99.8)
POC GRANULOCYTE: 2.2 (ref 2–6.9)
POC LYMPH PERCENT: 33.5 %L (ref 10–50)
POC MID %: 9.2 %M (ref 0–12)
Platelet Count, POC: 226 10*3/uL (ref 142–424)
RBC: 4.19 M/uL (ref 4.04–5.48)
RDW, POC: 15.1 %
WBC: 3.9 10*3/uL — AB (ref 4.6–10.2)

## 2013-10-19 LAB — POCT GLYCOSYLATED HEMOGLOBIN (HGB A1C): HEMOGLOBIN A1C: 5.2

## 2013-10-19 LAB — COMPREHENSIVE METABOLIC PANEL
ALK PHOS: 68 U/L (ref 39–117)
ALT: 23 U/L (ref 0–35)
AST: 24 U/L (ref 0–37)
Albumin: 4.3 g/dL (ref 3.5–5.2)
BILIRUBIN TOTAL: 0.5 mg/dL (ref 0.2–1.2)
BUN: 16 mg/dL (ref 6–23)
CO2: 27 mEq/L (ref 19–32)
CREATININE: 0.94 mg/dL (ref 0.50–1.10)
Calcium: 9.4 mg/dL (ref 8.4–10.5)
Chloride: 101 mEq/L (ref 96–112)
GLUCOSE: 79 mg/dL (ref 70–99)
Potassium: 4.1 mEq/L (ref 3.5–5.3)
Sodium: 138 mEq/L (ref 135–145)
Total Protein: 7 g/dL (ref 6.0–8.3)

## 2013-10-19 LAB — TSH: TSH: 1.859 u[IU]/mL (ref 0.350–4.500)

## 2013-10-19 MED ORDER — ESTROGENS, CONJUGATED 0.625 MG/GM VA CREA: TOPICAL_CREAM | VAGINAL | Status: DC

## 2013-10-19 NOTE — Progress Notes (Signed)
Physical exam  History: 53 year old lady who is here for her physical examination. She has been doing fairly well. She sees a psychiatrist, and has a prescription with a list of labs that he would like a copy of. She is doing well on his management of her depression, and she is to decreasing her medications. She had been on lipid-lowering medication in the past, but has not been taking it. She has a mole on the back of her right knee that she would like looked at. Generally she has been healthy.  Past history: Medical illnesses: Depression Surgical: C-section, D&C Regular medications: Bupropion, Zoloft, Abilify Allergies: None known  Family history Father is deceased from Hodgkin's lymphoma. Mother is living, has high cholesterol, and has had a uterine cancer. The patient has one sister who has had an early stage breast cancer at around age 15. She has 2 children.  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 Has 2 children at home, both of whom are involved in musical activities. The patient gets some regular exercise. She does not smoke drink or use drugs.  Review of systems: Constitutional: Unremarkable HEENT: Unremarkable gets a little sore places on her right nostril Respiratory: Unremarkable Cardiovascular: Unremarkable Gastrointestinal: Unremarkable Genitourinary: Has painful intercourse. She has an IUD still, not sure if she's going through menopause. Mr. skeletal: Unremarkable Dermatologic: Has a skin lesion behind her right knee she would like taken off Allergy: Unremarkable neurologic: Unremarkable Hematologic: Unremarkable Psychiatric history of depression, stable  Physical exam: Well-developed well-nourished lady in no major distress. TMs normal. Eyes PERRLA. Fundi benign. Nose normal. Throat clear. Neck supple without nodes thyromegaly. No carotid bruits. Chest clear to auscultation. Heart regular without murmurs gallops or  arrhythmias. Breasts are soft without masses. She has a little cyst in the posterior aspect of the left axilla which is been noted in the past. It could be a lipoma. It is soft. 1.5 seems in diameter. Abdomen soft without mass or tenderness. Normal female external genitalia. A little atrophic appearance. Vaginal mucosa unremarkable. No discharge. IUD string is present. No adnexal or uterine masses appreciated. Extremities unremarkable. Skin has a 1 CM diameter seborrheic keratosis behind her right knee. It could be a warty-like lesion.  Assessment: Annual physical exam Atrophic vulvovaginitis Skin lesion right popliteal fossa Lipoma versus cyst left axilla Family history of lymphoma, breast cancer, and uterine cancer Depression, stable  Plan: Remove the lesion from behind her knee Labs are pending  Procedure: Anesthetized with 1% lidocaine with epi. Shave biopsy the without difficulty. He tolerated well. Cauterized with electrocautery. The lesion was just under 1 CM is in diameter.  Treat the vulvovaginitis with some Estrace cream   Send copy of labs to Dr. Norma Fredrickson: Lipid profile, CMP, CBC, TSH, hemoglobin A1c and he requests that they be faxed to [phone]

## 2013-10-19 NOTE — Patient Instructions (Addendum)
Keep a Band-Aid on the lesion on the leg for a few days to protect it. It will crust up a little like a small burn.  Use the cream around her vaginal opening as discussed for about one week, then 3 times a week. Return if worse  Labs are pending  Get a mammogram  Use Polysporin ointment he knows as discussed

## 2013-10-19 NOTE — Addendum Note (Signed)
Addended by: Kem Boroughs D on: 10/19/2013 09:54 AM   Modules accepted: Orders

## 2013-10-21 LAB — PAP IG, CT-NG, RFX HPV ASCU
CHLAMYDIA PROBE AMP: NEGATIVE
GC Probe Amp: NEGATIVE

## 2013-10-22 ENCOUNTER — Encounter: Payer: Self-pay | Admitting: Family Medicine

## 2013-10-23 MED ORDER — SIMVASTATIN 20 MG PO TABS: 20.0000 mg | ORAL_TABLET | Freq: Every day | ORAL | Status: DC

## 2013-10-23 NOTE — Addendum Note (Signed)
Addended by: Constance Goltz on: 10/23/2013 10:37 AM   Modules accepted: Orders

## 2014-01-20 ENCOUNTER — Ambulatory Visit (INDEPENDENT_AMBULATORY_CARE_PROVIDER_SITE_OTHER): Payer: Commercial Managed Care - PPO | Admitting: Obstetrics & Gynecology

## 2014-01-20 ENCOUNTER — Encounter: Payer: Self-pay | Admitting: Obstetrics & Gynecology

## 2014-01-20 VITALS — BP 104/70 | HR 65 | Temp 98.0°F | Ht 67.0 in | Wt 141.9 lb

## 2014-01-20 DIAGNOSIS — Z1231 Encounter for screening mammogram for malignant neoplasm of breast: Secondary | ICD-10-CM

## 2014-01-20 DIAGNOSIS — Z30431 Encounter for routine checking of intrauterine contraceptive device: Secondary | ICD-10-CM

## 2014-01-20 NOTE — Progress Notes (Signed)
Subjective:     Patient ID: Alison Kelley, female   DOB: May 27, 1961, 53 y.o.   MRN: [id number]  HPI Pt presents for IUD check.  She reports that the string is scratching her spouse and leaving red welts on his penis.  She reports that she loves the IUD and does not want to have to remove it if possible.  She has had the strings trimmed previousl an dit did no resolved the problem.   She reports that she is not having an other problems with the IUD.   Review of Systems     Objective:   Physical Exam BP 104/70  Pulse 65  Temp(Src) 98 F (36.7 C)  Ht 5\' 7"  (1.702 m)  Wt 141 lb 14.4 oz (64.365 kg)  BMI 22.22 kg/m2  Prior to trimming th estrings I advised pt that if I trimmed the strings completely we may have issues removing the  IUD and may need to use an IUD hook or perform a hysteroscopy to remove the device.  Pt reported that this was fine with her as she wanted ot keep it but, didn;t want her husband to feel the strings GU: EGBUS: no lesions Vagina: no blood in vault Cervix: no lesion; no mucopurulent d/c; the strings were short and could not be tucked behind the cervix.  With the patient's permission, the strings were cut to be flush with the cervix.  The strings could not be seen or felt after trimming the strings.       Assessment:     IUD check/ strings trimmed (strings just inside the cervix)     Plan:     Screening mammogram F/u in 1 year for annual

## 2014-01-20 NOTE — Patient Instructions (Signed)
Levonorgestrel intrauterine device (IUD) What is this medicine? LEVONORGESTREL IUD (LEE voe nor jes trel) is a contraceptive (birth control) device. The device is placed inside the uterus by a healthcare professional. It is used to prevent pregnancy and can also be used to treat heavy bleeding that occurs during your period. Depending on the device, it can be used for 3 to 5 years. This medicine may be used for other purposes; ask your health care provider or pharmacist if you have questions. COMMON BRAND NAME(S): Mirena, Skyla What should I tell my health care provider before I take this medicine? They need to know if you have any of these conditions: -abnormal Pap smear -cancer of the breast, uterus, or cervix -diabetes -endometritis -genital or pelvic infection now or in the past -have more than one sexual partner or your partner has more than one partner -heart disease -history of an ectopic or tubal pregnancy -immune system problems -IUD in place -liver disease or tumor -problems with blood clots or take blood-thinners -use intravenous drugs -uterus of unusual shape -vaginal bleeding that has not been explained -an unusual or allergic reaction to levonorgestrel, other hormones, silicone, or polyethylene, medicines, foods, dyes, or preservatives -pregnant or trying to get pregnant -breast-feeding How should I use this medicine? This device is placed inside the uterus by a health care professional. Talk to your pediatrician regarding the use of this medicine in children. Special care may be needed. Overdosage: If you think you have taken too much of this medicine contact a poison control center or emergency room at once. NOTE: This medicine is only for you. Do not share this medicine with others. What if I miss a dose? This does not apply. What may interact with this medicine? Do not take this medicine with any of the following  medications: -amprenavir -bosentan -fosamprenavir This medicine may also interact with the following medications: -aprepitant -barbiturate medicines for inducing sleep or treating seizures -bexarotene -griseofulvin -medicines to treat seizures like carbamazepine, ethotoin, felbamate, oxcarbazepine, phenytoin, topiramate -modafinil -pioglitazone -rifabutin -rifampin -rifapentine -some medicines to treat HIV infection like atazanavir, indinavir, lopinavir, nelfinavir, tipranavir, ritonavir -St. John's wort -warfarin This list may not describe all possible interactions. Give your health care provider a list of all the medicines, herbs, non-prescription drugs, or dietary supplements you use. Also tell them if you smoke, drink alcohol, or use illegal drugs. Some items may interact with your medicine. What should I watch for while using this medicine? Visit your doctor or health care professional for regular check ups. See your doctor if you or your partner has sexual contact with others, becomes HIV positive, or gets a sexual transmitted disease. This product does not protect you against HIV infection (AIDS) or other sexually transmitted diseases. You can check the placement of the IUD yourself by reaching up to the top of your vagina with clean fingers to feel the threads. Do not pull on the threads. It is a good habit to check placement after each menstrual period. Call your doctor right away if you feel more of the IUD than just the threads or if you cannot feel the threads at all. The IUD may come out by itself. You may become pregnant if the device comes out. If you notice that the IUD has come out use a backup birth control method like condoms and call your health care provider. Using tampons will not change the position of the IUD and are okay to use during your period. What side effects may I   notice from receiving this medicine? Side effects that you should report to your doctor or  health care professional as soon as possible: -allergic reactions like skin rash, itching or hives, swelling of the face, lips, or tongue -fever, flu-like symptoms -genital sores -high blood pressure -no menstrual period for 6 weeks during use -pain, swelling, warmth in the leg -pelvic pain or tenderness -severe or sudden headache -signs of pregnancy -stomach cramping -sudden shortness of breath -trouble with balance, talking, or walking -unusual vaginal bleeding, discharge -yellowing of the eyes or skin Side effects that usually do not require medical attention (report to your doctor or health care professional if they continue or are bothersome): -acne -breast pain -change in sex drive or performance -changes in weight -cramping, dizziness, or faintness while the device is being inserted -headache -irregular menstrual bleeding within first 3 to 6 months of use -nausea This list may not describe all possible side effects. Call your doctor for medical advice about side effects. You may report side effects to FDA at 1-800-FDA-1088. Where should I keep my medicine? This does not apply. NOTE: This sheet is a summary. It may not cover all possible information. If you have questions about this medicine, talk to your doctor, pharmacist, or health care provider.  2014, Elsevier/Gold Standard. (2011-09-22 13:54:04)  

## 2014-01-20 NOTE — Progress Notes (Signed)
Patient reports that her partner can feel her strings.

## 2014-01-22 ENCOUNTER — Ambulatory Visit (HOSPITAL_COMMUNITY)
Admission: RE | Admit: 2014-01-22 | Discharge: 2014-01-22 | Disposition: A | Discharge status: Home / Self Care | Payer: Commercial Managed Care - PPO | Source: Ambulatory Visit | Attending: Obstetrics & Gynecology | Admitting: Obstetrics & Gynecology

## 2014-01-22 DIAGNOSIS — Z1231 Encounter for screening mammogram for malignant neoplasm of breast: Secondary | ICD-10-CM

## 2014-06-07 ENCOUNTER — Ambulatory Visit (INDEPENDENT_AMBULATORY_CARE_PROVIDER_SITE_OTHER): Payer: BC Managed Care – PPO | Admitting: Family Medicine

## 2014-06-07 VITALS — BP 118/68 | HR 103 | Temp 98.0°F | Resp 17 | Ht 67.5 in | Wt 149.0 lb

## 2014-06-07 DIAGNOSIS — S00212A Abrasion of left eyelid and periocular area, initial encounter: Secondary | ICD-10-CM

## 2014-06-07 DIAGNOSIS — R3129 Other microscopic hematuria: Secondary | ICD-10-CM

## 2014-06-07 DIAGNOSIS — R312 Other microscopic hematuria: Secondary | ICD-10-CM

## 2014-06-07 DIAGNOSIS — R300 Dysuria: Secondary | ICD-10-CM

## 2014-06-07 DIAGNOSIS — A499 Bacterial infection, unspecified: Secondary | ICD-10-CM

## 2014-06-07 DIAGNOSIS — N390 Urinary tract infection, site not specified: Secondary | ICD-10-CM

## 2014-06-07 LAB — POCT UA - MICROSCOPIC ONLY
Casts, Ur, LPF, POC: NEGATIVE
Crystals, Ur, HPF, POC: NEGATIVE
Mucus, UA: NEGATIVE
WBC, Ur, HPF, POC: NEGATIVE
Yeast, UA: NEGATIVE

## 2014-06-07 LAB — POCT URINALYSIS DIPSTICK
Bilirubin, UA: NEGATIVE
Glucose, UA: NEGATIVE
Ketones, UA: NEGATIVE
Protein, UA: NEGATIVE
Spec Grav, UA: <=1.005
Urobilinogen, UA: 0.2
pH, UA: 6

## 2014-06-07 MED ORDER — MUPIROCIN CALCIUM 2 % EX CREA: 1.0000 "application " | TOPICAL_CREAM | Freq: Two times a day (BID) | CUTANEOUS | Status: DC | PRN

## 2014-06-07 MED ORDER — CIPROFLOXACIN HCL 500 MG PO TABS: 500.0000 mg | ORAL_TABLET | Freq: Two times a day (BID) | ORAL | Status: DC

## 2014-06-07 NOTE — Patient Instructions (Signed)
Urinary Tract Infection °Urinary tract infections (UTIs) can develop anywhere along your urinary tract. Your urinary tract is your body's drainage system for removing wastes and extra water. Your urinary tract includes two kidneys, two ureters, a bladder, and a urethra. Your kidneys are a pair of bean-shaped organs. Each kidney is about the size of your fist. They are located below your ribs, one on each side of your spine. °CAUSES °Infections are caused by microbes, which are microscopic organisms, including fungi, viruses, and bacteria. These organisms are so small that they can only be seen through a microscope. Bacteria are the microbes that most commonly cause UTIs. °SYMPTOMS  °Symptoms of UTIs may vary by age and gender of the patient and by the location of the infection. Symptoms in young women typically include a frequent and intense urge to urinate and a painful, burning feeling in the bladder or urethra during urination. Older women and men are more likely to be tired, shaky, and weak and have muscle aches and abdominal pain. A fever may mean the infection is in your kidneys. Other symptoms of a kidney infection include pain in your back or sides below the ribs, nausea, and vomiting. °DIAGNOSIS °To diagnose a UTI, your caregiver will ask you about your symptoms. Your caregiver also will ask to provide a urine sample. The urine sample will be tested for bacteria and white blood cells. White blood cells are made by your body to help fight infection. °TREATMENT  °Typically, UTIs can be treated with medication. Because most UTIs are caused by a bacterial infection, they usually can be treated with the use of antibiotics. The choice of antibiotic and length of treatment depend on your symptoms and the type of bacteria causing your infection. °HOME CARE INSTRUCTIONS °· If you were prescribed antibiotics, take them exactly as your caregiver instructs you. Finish the medication even if you feel better after you  have only taken some of the medication. °· Drink enough water and fluids to keep your urine clear or pale yellow. °· Avoid caffeine, tea, and carbonated beverages. They tend to irritate your bladder. °· Empty your bladder often. Avoid holding urine for long periods of time. °· Empty your bladder before and after sexual intercourse. °· After a bowel movement, women should cleanse from front to back. Use each tissue only once. °SEEK MEDICAL CARE IF:  °· You have back pain. °· You develop a fever. °· Your symptoms do not begin to resolve within 3 days. °SEEK IMMEDIATE MEDICAL CARE IF:  °· You have severe back pain or lower abdominal pain. °· You develop chills. °· You have nausea or vomiting. °· You have continued burning or discomfort with urination. °MAKE SURE YOU:  °· Understand these instructions. °· Will watch your condition. °· Will get help right away if you are not doing well or get worse. °Document Released: 06/01/2005 Document Revised: 02/21/2012 Document Reviewed: 09/30/2011 °Exit[employer]® Patient Information ©2015 [employer]. This information is not intended to replace advice given to you by your health care provider. Make sure you discuss any questions you have with your health care provider. ° °

## 2014-06-07 NOTE — Progress Notes (Signed)
Chief Complaint:  Chief Complaint  Patient presents with  . urine frequency  . Dysuria    HPI: Alison Kelley is a 53 y.o. female who is here for  UTI sxs: urgency, frequency, and dysuria.  She has been stressed at work and also not drinking.   She denies fevers, chills. Has tried AZO Has had some upper pelic pain, Denies fevers, chills, nausea, vomiting Sexually acitve, no STDs,  LMP was 2008   Past Medical History  Diagnosis Date  . Depression   . High cholesterol    Past Surgical History  Procedure Laterality Date  . Dilation and curettage of uterus    . Cesarean section     History   Social History  . Marital Status: Married    Spouse Name: N/A    Number of Children: N/A  . Years of Education: N/A   Social History Main Topics  . Smoking status: Never Smoker   . Smokeless tobacco: Never Used  . Alcohol Use: Yes     Comment: rarely wine  . Drug Use: No  . Sexual Activity: Yes    Birth Control/ Protection: IUD   Other Topics Concern  . None   Social History Narrative  . None   Family History  Problem Relation Age of Onset  . Cancer Father   . Uterine cancer Mother   . Breast cancer Sister   . Cancer Sister   . Depression Son   . ADD / ADHD Son   . ADD / ADHD Son   . Cancer Maternal Uncle    No Known Allergies Prior to Admission medications   Medication Sig Start Date End Date Taking? Authorizing Provider  buPROPion (WELLBUTRIN XL) 300 MG 24 hr tablet Take 300 mg by mouth daily.   Yes Historical Provider, MD  levonorgestrel (MIRENA) 20 MCG/24HR IUD 1 Intra Uterine Device (1 each total) by Intrauterine route once. 05/16/12 06/07/14 Yes Lavonia Drafts, MD  sertraline (ZOLOFT) 100 MG tablet Take 100 mg by mouth daily.   Yes Historical Provider, MD     ROS: The patient denies fevers, chills, night sweats, unintentional weight loss, chest pain, palpitations, wheezing, dyspnea on exertion, nausea, vomiting, abdominal pain,  melena, numbness, weakness, or tingling.  All other systems have been reviewed and were otherwise negative with the exception of those mentioned in the HPI and as above.    PHYSICAL EXAM: Filed Vitals:   06/07/14 1236  BP: 118/68  Pulse: 103  Temp: 98 F (36.7 C)  Resp: 17   Filed Vitals:   06/07/14 1236  Height: 5' 7.5" (1.715 m)  Weight: 149 lb (67.586 kg)   Body mass index is 22.98 kg/(m^2).  General: Alert, no acute distress HEENT:  Normocephalic, atraumatic, oropharynx patent. EOMI, PERRLA, no exudates, nares is normal Cardiovascular:  Regular rate and rhythm, no rubs murmurs or gallops.  adial pulse intact. No pedal edema.  Respiratory: Clear to auscultation bilaterally.  No wheezes, rales, or rhonchi.  No cyanosis, no use of accessory musculature GI: No organomegaly, abdomen is soft and non-tender, positive bowel sounds.  No masses. Skin: + left abrasion upper eyelid near nasal bridge Neurologic: Facial musculature symmetric. Psychiatric: Patient is appropriate throughout our interaction. Lymphatic: No cervical lymphadenopathy Musculoskeletal: Gait intact. NO CVA tenderness   LABS: Results for orders placed in visit on 06/07/14  POCT UA - MICROSCOPIC ONLY      Result Value Ref Range   WBC, Ur, HPF, POC neg  RBC, urine, microscopic 0-1     Bacteria, U Microscopic trace     Mucus, UA neg     Epithelial cells, urine per micros 0-2     Crystals, Ur, HPF, POC neg     Casts, Ur, LPF, POC neg     Yeast, UA neg    POCT URINALYSIS DIPSTICK      Result Value Ref Range   Color, UA orange     Clarity, UA clear     Glucose, UA neg     Bilirubin, UA neg     Ketones, UA neg     Spec Grav, UA <=1.005     Blood, UA trace-lysed     pH, UA 6.0     Protein, UA neg     Urobilinogen, UA 0.2     Nitrite, UA postive     Leukocytes, UA Trace       EKG/XRAY:   Primary read interpreted by Dr. Marin Comment at [facility].   ASSESSMENT/PLAN: Encounter Diagnoses  Name Primary?  .  Dysuria   . Abrasion of eyelid, left, initial encounter   . UTI (urinary tract infection), bacterial Yes  . Macroscopic hematuria    Bactroban for abrasion Cipro x 7 days  For UTI Urine cx pending F/u prn  Gross sideeffects, risk and benefits, and alternatives of medications d/w patient. Patient is aware that all medications have potential sideeffects and we are unable to predict every sideeffect or drug-drug interaction that may occur.  Elyn Krogh, [place], DO 06/07/2014 2:24 PM

## 2014-06-10 LAB — URINE CULTURE: Colony Count: 40000

## 2014-10-04 ENCOUNTER — Ambulatory Visit (INDEPENDENT_AMBULATORY_CARE_PROVIDER_SITE_OTHER): Payer: 59 | Admitting: Family Medicine

## 2014-10-04 VITALS — BP 118/62 | HR 67 | Temp 98.2°F | Resp 18 | Ht 67.5 in | Wt 155.0 lb

## 2014-10-04 DIAGNOSIS — Z1211 Encounter for screening for malignant neoplasm of colon: Secondary | ICD-10-CM

## 2014-10-04 DIAGNOSIS — F32A Depression, unspecified: Secondary | ICD-10-CM

## 2014-10-04 DIAGNOSIS — F329 Major depressive disorder, single episode, unspecified: Secondary | ICD-10-CM

## 2014-10-04 DIAGNOSIS — Z0000 Encounter for general adult medical examination without abnormal findings: Secondary | ICD-10-CM

## 2014-10-04 DIAGNOSIS — E755 Other lipid storage disorders: Secondary | ICD-10-CM

## 2014-10-04 LAB — POCT URINALYSIS DIPSTICK
Bilirubin, UA: NEGATIVE
Blood, UA: NEGATIVE
Glucose, UA: NEGATIVE
Ketones, UA: NEGATIVE
Nitrite, UA: NEGATIVE
Protein, UA: NEGATIVE
Spec Grav, UA: 1.015
Urobilinogen, UA: 0.2
pH, UA: 8

## 2014-10-04 LAB — POCT CBC
Granulocyte percent: 65.3 %G (ref 37–80)
HCT, POC: 40.1 % (ref 37.7–47.9)
Hemoglobin: 13.1 g/dL (ref 12.2–16.2)
Lymph, poc: 1.6 (ref 0.6–3.4)
MCH, POC: 30.8 pg (ref 27–31.2)
MCHC: 32.8 g/dL (ref 31.8–35.4)
MCV: 94.1 fL (ref 80–97)
MID (cbc): 0.1 (ref 0–0.9)
MPV: 7 fL (ref 0–99.8)
POC Granulocyte: 3.3 (ref 2–6.9)
POC LYMPH PERCENT: 32.5 %L (ref 10–50)
POC MID %: 2.2 %M (ref 0–12)
Platelet Count, POC: 248 10*3/uL (ref 142–424)
RBC: 4.26 M/uL (ref 4.04–5.48)
RDW, POC: 13.8 %
WBC: 5 10*3/uL (ref 4.6–10.2)

## 2014-10-04 LAB — COMPREHENSIVE METABOLIC PANEL
ALT: 45 U/L — ABNORMAL HIGH (ref 0–35)
AST: 43 U/L — ABNORMAL HIGH (ref 0–37)
Albumin: 4.1 g/dL (ref 3.5–5.2)
Alkaline Phosphatase: 78 U/L (ref 39–117)
BUN: 11 mg/dL (ref 6–23)
CO2: 29 mEq/L (ref 19–32)
Calcium: 9.7 mg/dL (ref 8.4–10.5)
Chloride: 102 mEq/L (ref 96–112)
Creat: 0.85 mg/dL (ref 0.50–1.10)
Glucose, Bld: 86 mg/dL (ref 70–99)
Potassium: 4.7 mEq/L (ref 3.5–5.3)
Sodium: 137 mEq/L (ref 135–145)
Total Bilirubin: 0.6 mg/dL (ref 0.2–1.2)
Total Protein: 7 g/dL (ref 6.0–8.3)

## 2014-10-04 LAB — LIPID PANEL
Cholesterol: 275 mg/dL — ABNORMAL HIGH (ref 0–200)
HDL: 72 mg/dL (ref >39)
LDL Cholesterol: 186 mg/dL — ABNORMAL HIGH (ref 0–99)
Total CHOL/HDL Ratio: 3.8 Ratio
Triglycerides: 84 mg/dL (ref <150)
VLDL: 17 mg/dL (ref 0–40)

## 2014-10-04 LAB — HEPATITIS C ANTIBODY: HCV Ab: NEGATIVE

## 2014-10-04 LAB — TSH: TSH: 2.137 u[IU]/mL (ref 0.350–4.500)

## 2014-10-04 MED ORDER — TACROLIMUS 0.03 % EX OINT: TOPICAL_OINTMENT | Freq: Two times a day (BID) | CUTANEOUS | Status: DC

## 2014-10-04 MED ORDER — BUPROPION HCL ER (XL) 300 MG PO TB24: 300.0000 mg | ORAL_TABLET | Freq: Every day | ORAL | Status: DC

## 2014-10-04 MED ORDER — SERTRALINE HCL 100 MG PO TABS: 100.0000 mg | ORAL_TABLET | Freq: Every day | ORAL | Status: DC

## 2014-10-04 MED ORDER — SIMVASTATIN 20 MG PO TABS: 20.0000 mg | ORAL_TABLET | Freq: Every day | ORAL | Status: DC

## 2014-10-04 NOTE — Addendum Note (Signed)
Addended by: Tommas Olp B on: 10/04/2014 02:39 PM   Modules accepted: Orders

## 2014-10-04 NOTE — Progress Notes (Addendum)
This chart was scribed for Dr. Robyn Haber, MD by Erling Conte, Medical Scribe. This patient was seen in Room 3 and the patient's care was started at 11:00 AM.   Patient ID: Alison Kelley MRN: [id number], DOB: May 15, 1961, 54 y.o. Date of Encounter: 10/04/2014, 10:59 AM  Primary Physician: Kennon Portela, MD  Chief Complaint: Physical (CPE)  HPI: 54 y.o. y/o female with history of noted below here for CPE.  Doing well. No issues/complaints.Pt currently takes Wellbutrin, and Zoloft. Pt has ongoing nasal irritation for which she takes Bactroban  LMP: 2008. Pt has Mirena IUD Pap: no recent abnormal paps. MMG: 2015 Colonoscopy: never had a colonoscopy Last Td: 2007 Flu: she got her yearly flu shot in the fall  Pt works as a Pharmacist, hospital for [employer].   Review of Systems: Consitutional: No fever, chills, fatigue, night sweats, lymphadenopathy, or weight changes. Eyes: No visual changes, eye redness, or discharge. ENT/Mouth: Ears: No otalgia, tinnitus, hearing loss, discharge. Nose: No congestion, rhinorrhea, sinus pain, or epistaxis. Throat: No sore throat, post nasal drip, or teeth pain. Cardiovascular: No CP, palpitations, diaphoresis, DOE, edema, orthopnea, PND. Respiratory: No cough, hemoptysis, SOB, or wheezing. Gastrointestinal: No anorexia, dysphagia, reflux, pain, nausea, vomiting, hematemesis, diarrhea, constipation, BRBPR, or melena. Breast: No discharge, pain, swelling, or mass. Genitourinary: No dysuria, frequency, urgency, hematuria, incontinence, nocturia, amenorrhea, vaginal discharge, pruritis, burning, abnormal bleeding, or pain. Musculoskeletal: No decreased ROM, myalgias, stiffness, joint swelling, or weakness. Skin: No rash, erythema, lesion changes, pain, warmth, jaundice, or pruritis. Neurological: No headache, dizziness, syncope, seizures, tremors, memory loss, coordination problems, or paresthesias. Psychological: No anxiety,  depression, hallucinations, SI/HI. Endocrine: No fatigue, polydipsia, polyphagia, polyuria, or known diabetes. All other systems were reviewed and are otherwise negative.  Past Medical History  Diagnosis Date  . Depression   . High cholesterol   . Anxiety      Past Surgical History  Procedure Laterality Date  . Dilation and curettage of uterus    . Cesarean section      Home Meds:  Prior to Admission medications   Medication Sig Start Date End Date Taking? Authorizing Provider  buPROPion (WELLBUTRIN XL) 300 MG 24 hr tablet Take 300 mg by mouth daily.   Yes Historical Provider, MD  sertraline (ZOLOFT) 100 MG tablet Take 100 mg by mouth daily.   Yes Historical Provider, MD  levonorgestrel (MIRENA) 20 MCG/24HR IUD 1 Intra Uterine Device (1 each total) by Intrauterine route once. 05/16/12 06/07/14  Lavonia Drafts, MD  mupirocin cream (BACTROBAN) 2 % Apply 1 application topically 2 (two) times daily as needed. Patient not taking: Reported on 10/04/2014 06/07/14   Thao P Le, DO    Allergies: No Known Allergies  History   Social History  . Marital Status: Married    Spouse Name: N/A    Number of Children: N/A  . Years of Education: N/A   Occupational History  . Not on file.   Social History Main Topics  . Smoking status: Never Smoker   . Smokeless tobacco: Never Used  . Alcohol Use: Yes     Comment: rarely wine  . Drug Use: No  . Sexual Activity: Yes    Birth Control/ Protection: IUD   Other Topics Concern  . Not on file   Social History Narrative    Family History  Problem Relation Age of Onset  . Cancer Father   . Uterine cancer Mother   . Breast cancer Sister   . Cancer Sister   .  Depression Son   . ADD / ADHD Son   . ADD / ADHD Son   . Cancer Maternal Uncle     Physical Exam: Blood pressure 118/62, pulse 67, temperature 98.2 F (36.8 C), temperature source Oral, resp. rate 18, height 5' 7.5" (1.715 m), weight 155 lb (70.308 kg), SpO2 97 %., Body  mass index is 23.9 kg/(m^2). Wt Readings from Last 3 Encounters:  10/04/14 155 lb (70.308 kg)  06/07/14 149 lb (67.586 kg)  01/20/14 141 lb 14.4 oz (64.365 kg)   BP Readings from Last 3 Encounters:  10/04/14 118/62  06/07/14 118/68  01/20/14 104/70   General: Well developed, well nourished, in no acute distress. HEENT: Normocephalic, atraumatic. Conjunctiva pink, sclera non-icteric. Pupils 2 mm constricting to 1 mm, round, regular, and equally reactive to light and accomodation. EOMI. Fundi benign   Internal auditory canal clear. TMs with good cone of light and without pathology. Nasal mucosa pink. Nares are without discharge. No sinus tenderness. Oral mucosa pink. Dentition good. Pharynx without exudate.    Neck: Supple. Trachea midline. No thyromegaly. Full ROM. No lymphadenopathy. Lungs: Clear to auscultation bilaterally without wheezes, rales, or rhonchi. Breathing is of normal effort and unlabored. Cardiovascular: RRR with S1 S2. No murmurs, rubs, or gallops appreciated. Distal pulses 2+ symmetrically. No carotid or abdominal bruits. Breast: Symmetrical. No masses. Nipples without discharge. Abdomen: Soft, non-tender, non-distended with normoactive bowel sounds. No hepatosplenomegaly or masses. No rebound/guarding. No CVA tenderness. Without hernias.  Genitourinary:  External genitalia without lesions. Vaginal mucosa pink. Cervix pink and without discharge. No cervical or adnexal tenderness. Pap smear taken Musculoskeletal: Full range of motion and 5/5 strength throughout. Without swelling, atrophy, tenderness, crepitus, or warmth. Extremities without clubbing, cyanosis, or edema. Calves supple. Skin: Warm and moist without erythema, ecchymosis, wounds, or rash. Neuro: A+Ox3. CN II-XII grossly intact. Moves all extremities spontaneously. Full sensation throughout. Normal gait. DTR 2+ throughout upper and lower extremities. Finger to nose intact. Psych:  Responds to questions  appropriately with a normal affect. GU: normal external female genitalia, normal vagina, normal cervix with parous and normal sized fundus and no adnexal masses. Exam was non tender   Lab Results  Component Value Date   CHOL 264* 10/19/2013   HDL 60 10/19/2013   LDLCALC 191* 10/19/2013   LDLDIRECT 172.5 10/30/2009   TRIG 64 10/19/2013   CHOLHDL 4.4 10/19/2013     Assessment/Plan:  54 y.o. y/o female here for CPE This chart was scribed in my presence and reviewed by me personally.    ICD-9-CM ICD-10-CM   1. 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 Depression 311 F32.9 buPROPion (WELLBUTRIN XL) 300 MG 24 hr tablet     sertraline (ZOLOFT) 100 MG tablet     Signed, Robyn Haber, MD 10/04/2014 10:59 AM

## 2014-10-04 NOTE — Patient Instructions (Signed)
Let me know when your insurance is in full force so we can refer you for a colonoscopy   Health Maintenance Adopting a healthy lifestyle and getting preventive care can go a long way to promote health and wellness. Talk with your health care provider about what schedule of regular examinations is right for you. This is a good chance for you to check in with your provider about disease prevention and staying healthy. In between checkups, there are plenty of things you can do on your own. Experts have done a lot of research about which lifestyle changes and preventive measures are most likely to keep you healthy. Ask your health care provider for more information. WEIGHT AND DIET  Eat a healthy diet  Be sure to include plenty of vegetables, fruits, low-fat dairy products, and lean protein.  Do not eat a lot of foods high in solid fats, added sugars, or salt.  Get regular exercise. This is one of the most important things you can do for your health.  Most adults should exercise for at least 150 minutes each week. The exercise should increase your heart rate and make you sweat (moderate-intensity exercise).  Most adults should also do strengthening exercises at least twice a week. This is in addition to the moderate-intensity exercise.  Maintain a healthy weight  Body mass index (BMI) is a measurement that can be used to identify possible weight problems. It estimates body fat based on height and weight. Your health care provider can help determine your BMI and help you achieve or maintain a healthy weight.  For females 17 years of age and older:   A BMI below 18.5 is considered underweight.  A BMI of 18.5 to 24.9 is normal.  A BMI of 25 to 29.9 is considered overweight.  A BMI of 30 and above is considered obese.  Watch levels of cholesterol and blood lipids  You should start having your blood tested for lipids and cholesterol at 53 years of age, then have this test every 5  years.  You may need to have your cholesterol levels checked more often if:  Your lipid or cholesterol levels are high.  You are older than 53 years of age.  You are at high risk for heart disease.  CANCER SCREENING   Lung Cancer  Lung cancer screening is recommended for adults 58-38 years old who are at high risk for lung cancer because of a history of smoking.  A yearly low-dose CT scan of the lungs is recommended for people who:  Currently smoke.  Have quit within the past 15 years.  Have at least a 30-pack-year history of smoking. A pack year is smoking an average of one pack of cigarettes a day for 1 year.  Yearly screening should continue until it has been 15 years since you quit.  Yearly screening should stop if you develop a health problem that would prevent you from having lung cancer treatment.  Breast Cancer  Practice breast self-awareness. This means understanding how your breasts normally appear and feel.  It also means doing regular breast self-exams. Let your health care provider know about any changes, no matter how small.  If you are in your 20s or 30s, you should have a clinical breast exam (CBE) by a health care provider every 1-3 years as part of a regular health exam.  If you are 42 or older, have a CBE every year. Also consider having a breast X-ray (mammogram) every year.  If you  have a family history of breast cancer, talk to your health care provider about genetic screening.  If you are at high risk for breast cancer, talk to your health care provider about having an MRI and a mammogram every year.  Breast cancer gene (BRCA) assessment is recommended for women who have family members with BRCA-related cancers. BRCA-related cancers include:  Breast.  Ovarian.  Tubal.  Peritoneal cancers.  Results of the assessment will determine the need for genetic counseling and BRCA1 and BRCA2 testing. Cervical Cancer Routine pelvic examinations to  screen for cervical cancer are no longer recommended for nonpregnant women who are considered low risk for cancer of the pelvic organs (ovaries, uterus, and vagina) and who do not have symptoms. A pelvic examination may be necessary if you have symptoms including those associated with pelvic infections. Ask your health care provider if a screening pelvic exam is right for you.   The Pap test is the screening test for cervical cancer for women who are considered at risk.  If you had a hysterectomy for a problem that was not cancer or a condition that could lead to cancer, then you no longer need Pap tests.  If you are older than 65 years, and you have had normal Pap tests for the past 10 years, you no longer need to have Pap tests.  If you have had past treatment for cervical cancer or a condition that could lead to cancer, you need Pap tests and screening for cancer for at least 20 years after your treatment.  If you no longer get a Pap test, assess your risk factors if they change (such as having a new sexual partner). This can affect whether you should start being screened again.  Some women have medical problems that increase their chance of getting cervical cancer. If this is the case for you, your health care provider may recommend more frequent screening and Pap tests.  The human papillomavirus (HPV) test is another test that may be used for cervical cancer screening. The HPV test looks for the virus that can cause cell changes in the cervix. The cells collected during the Pap test can be tested for HPV.  The HPV test can be used to screen women 66 years of age and older. Getting tested for HPV can extend the interval between normal Pap tests from three to five years.  An HPV test also should be used to screen women of any age who have unclear Pap test results.  After 53 years of age, women should have HPV testing as often as Pap tests.  Colorectal Cancer  This type of cancer can be  detected and often prevented.  Routine colorectal cancer screening usually begins at 53 years of age and continues through 53 years of age.  Your health care provider may recommend screening at an earlier age if you have risk factors for colon cancer.  Your health care provider may also recommend using home test kits to check for hidden blood in the stool.  A small camera at the end of a tube can be used to examine your colon directly (sigmoidoscopy or colonoscopy). This is done to check for the earliest forms of colorectal cancer.  Routine screening usually begins at age 25.  Direct examination of the colon should be repeated every 5-10 years through 53 years of age. However, you may need to be screened more often if early forms of precancerous polyps or small growths are found. Skin Cancer  Check  your skin from head to toe regularly.  Tell your health care provider about any new moles or changes in moles, especially if there is a change in a mole's shape or color.  Also tell your health care provider if you have a mole that is larger than the size of a pencil eraser.  Always use sunscreen. Apply sunscreen liberally and repeatedly throughout the day.  Protect yourself by wearing long sleeves, pants, a wide-brimmed hat, and sunglasses whenever you are outside. HEART DISEASE, DIABETES, AND HIGH BLOOD PRESSURE   Have your blood pressure checked at least every 1-2 years. High blood pressure causes heart disease and increases the risk of stroke.  If you are between 34 years and 60 years old, ask your health care provider if you should take aspirin to prevent strokes.  Have regular diabetes screenings. This involves taking a blood sample to check your fasting blood sugar level.  If you are at a normal weight and have a low risk for diabetes, have this test once every three years after 53 years of age.  If you are overweight and have a high risk for diabetes, consider being tested at a  younger age or more often. PREVENTING INFECTION  Hepatitis B  If you have a higher risk for hepatitis B, you should be screened for this virus. You are considered at high risk for hepatitis B if:  You were born in a country where hepatitis B is common. Ask your health care provider which countries are considered high risk.  Your parents were born in a high-risk country, and you have not been immunized against hepatitis B (hepatitis B vaccine).  You have HIV or AIDS.  You use needles to inject street drugs.  You live with someone who has hepatitis B.  You have had sex with someone who has hepatitis B.  You get hemodialysis treatment.  You take certain medicines for conditions, including cancer, organ transplantation, and autoimmune conditions. Hepatitis C  Blood testing is recommended for:  Everyone born from [birth year] through 1964.  Anyone with known risk factors for hepatitis C. Sexually transmitted infections (STIs)  You should be screened for sexually transmitted infections (STIs) including gonorrhea and chlamydia if:  You are sexually active and are younger than 53 years of age.  You are older than 53 years of age and your health care provider tells you that you are at risk for this type of infection.  Your sexual activity has changed since you were last screened and you are at an increased risk for chlamydia or gonorrhea. Ask your health care provider if you are at risk.  If you do not have HIV, but are at risk, it may be recommended that you take a prescription medicine daily to prevent HIV infection. This is called pre-exposure prophylaxis (PrEP). You are considered at risk if:  You are sexually active and do not regularly use condoms or know the HIV status of your partner(s).  You take drugs by injection.  You are sexually active with a partner who has HIV. Talk with your health care provider about whether you are at high risk of being infected with HIV. If you choose  to begin PrEP, you should first be tested for HIV. You should then be tested every 3 months for as long as you are taking PrEP.  PREGNANCY   If you are premenopausal and you may become pregnant, ask your health care provider about preconception counseling.  If you may become pregnant, take  400 to 800 micrograms (mcg) of folic acid every day.  If you want to prevent pregnancy, talk to your health care provider about birth control (contraception). OSTEOPOROSIS AND MENOPAUSE   Osteoporosis is a disease in which the bones lose minerals and strength with aging. This can result in serious bone fractures. Your risk for osteoporosis can be identified using a bone density scan.  If you are 23 years of age or older, or if you are at risk for osteoporosis and fractures, ask your health care provider if you should be screened.  Ask your health care provider whether you should take a calcium or vitamin D supplement to lower your risk for osteoporosis.  Menopause may have certain physical symptoms and risks.  Hormone replacement therapy may reduce some of these symptoms and risks. Talk to your health care provider about whether hormone replacement therapy is right for you.  HOME CARE INSTRUCTIONS   Schedule regular health, dental, and eye exams.  Stay current with your immunizations.   Do not use any tobacco products including cigarettes, chewing tobacco, or electronic cigarettes.  If you are pregnant, do not drink alcohol.  If you are breastfeeding, limit how much and how often you drink alcohol.  Limit alcohol intake to no more than 1 drink per day for nonpregnant women. One drink equals 12 ounces of beer, 5 ounces of wine, or 1 ounces of hard liquor.  Do not use street drugs.  Do not share needles.  Ask your health care provider for help if you need support or information about quitting drugs.  Tell your health care provider if you often feel depressed.  Tell your health care  provider if you have ever been abused or do not feel safe at home. Document Released: 03/07/2011 Document Revised: 01/06/2014 Document Reviewed: 07/24/2013 [facility] Patient Information 2015 [place], [place]. This information is not intended to replace advice given to you by your health care provider. Make sure you discuss any questions you have with your health care provider.

## 2014-10-06 LAB — PAP IG W/ RFLX HPV ASCU

## 2015-05-20 ENCOUNTER — Ambulatory Visit (INDEPENDENT_AMBULATORY_CARE_PROVIDER_SITE_OTHER): Payer: Self-pay | Admitting: Urgent Care

## 2015-05-20 ENCOUNTER — Telehealth: Payer: Self-pay

## 2015-05-20 VITALS — BP 114/80 | HR 74 | Temp 97.8°F | Resp 18 | Ht 67.0 in | Wt 172.8 lb

## 2015-05-20 DIAGNOSIS — Z1239 Encounter for other screening for malignant neoplasm of breast: Secondary | ICD-10-CM

## 2015-05-20 DIAGNOSIS — L089 Local infection of the skin and subcutaneous tissue, unspecified: Secondary | ICD-10-CM

## 2015-05-20 DIAGNOSIS — Z1211 Encounter for screening for malignant neoplasm of colon: Secondary | ICD-10-CM

## 2015-05-20 DIAGNOSIS — L821 Other seborrheic keratosis: Secondary | ICD-10-CM

## 2015-05-20 MED ORDER — MUPIROCIN 2 % EX OINT: 1.0000 "application " | TOPICAL_OINTMENT | Freq: Three times a day (TID) | CUTANEOUS | Status: DC

## 2015-05-20 NOTE — Patient Instructions (Signed)
-   Take Zyrtec, Allegra or Claritin over the counter for any allergy component your possible skin infection is causing.   Seborrheic Keratosis Seborrheic keratosis is a common, noncancerous (benign) skin growth that can occur anywhere on the skin.It looks like "stuck-on," waxy, rough, tan, brown, or black spots on the skin. These skin growths can be flat or raised.They are often called "barnacles" because of their pasted-on appearance.Usually, these skin growths appear in adulthood, around age 83, and increase in number as you age. They may also develop during pregnancy or following estrogen therapy. Many people may only have one growth appear in their lifetime, while some people may develop many growths. CAUSES It is unknown what causes these skin growths, but they appear to run in families. SYMPTOMS Seborrheic keratosis is often located on the face, chest, shoulders, back, or other areas. These growths are:  Usually painless, but may become irritated and itchy.  Yellow, brown, black, or other colors.  Slightly raised or have a flat surface.  Sometimes rough or wart-like in texture.  Often waxy on the surface.  Round or oval-shaped.  Sometimes "stuck-on" in appearance.  Sometimes single, but there are usually many growths. Any growth that bleeds, itches on a regular basis, becomes inflamed, or becomes irritated needs to be evaluated by a skin specialist (dermatologist). DIAGNOSIS Diagnosis is mainly based on the way the growths appear. In some cases, it can be difficult to tell this type of skin growth from skin cancer. A skin growth tissue sample (biopsy) may be used to confirm the diagnosis. TREATMENT Most often, treatment is not needed because the skin growths are benign.If the skin growth is irritated easily by clothing or jewelry, causing it to scab or bleed, treatment may be recommended. Patients may also choose to have the growths removed because they do not like their  appearance. Most commonly, these growths are treated with cryosurgery. In cryosurgery, liquid nitrogen is applied to "freeze" the growth. The growth usually falls off within a matter of days. A blister may form and dry into a scab that will also fall off. After the growth or scab falls off, it may leave a dark or light spot on the skin. This color may fade over time, or it may remain permanent on the skin. HOME CARE INSTRUCTIONS If the skin growths are treated with cryosurgery, the treated area needs to be kept clean with water and soap. SEEK MEDICAL CARE IF:  You have questions about these growths or other skin problems.  You develop new symptoms, including:  A change in the appearance of the skin growth.  New growths.  Any bleeding, itching, or pain in the growths.  A skin growth that looks similar to seborrheic keratosis. Document Released: 09/24/2010 Document Revised: 11/14/2011 Document Reviewed: 09/24/2010 [facility] Patient Information 2015 [place], [place]. This information is not intended to replace advice given to you by your health care provider. Make sure you discuss any questions you have with your health care provider.

## 2015-05-20 NOTE — Progress Notes (Signed)
    MRN: [id number] DOB: [birth year]  Subjective:   Alison Kelley is a 54 y.o. female presenting for chief complaint of Insect Bite  Insect bite - reports 3 weak history of possible insect bite to her left thigh. Patient does not specifically recall seeing a bug bite she just remembers feeling a weird sensation over her leg and over the subsequent days noticed 2 small "bites" with slight tenderness that has not gone away over the past 3 weeks. She has not taken any medications for this. Denies fever, redness, swelling, drainage or pus or bleeding. She is worried that after looking insect bites up on the Internet she may have necrosis or deeper skin infection.   Skin lesion - reports a several month history of a skin lesion over her forehead. She wanted [place] to make sure that it is not cancerous and plans on seeing a dermatologist when she gets insurance. Skin lesion generally doesn't bother her, denies review of systems as above.  Denies any other aggravating or relieving factors, no other questions or concerns.  Joua has a current medication list which includes the following prescription(s): bupropion, sertraline, and simvastatin. Also has No Known Allergies.  Hillarie  has a past medical history of Depression; High cholesterol; and Anxiety. Also  has past surgical history that includes Dilation and curettage of uterus and Cesarean section.  Objective:   Vitals: BP 114/80 mmHg  Pulse 74  Temp(Src) 97.8 F (36.6 C) (Oral)  Resp 18  Ht 5\' 7"  (1.702 m)  Wt 172 lb 12.8 oz (78.382 kg)  BMI 27.06 kg/m2  SpO2 98%  Physical Exam  Constitutional: She is oriented to person, place, and time. She appears well-developed and well-nourished.  HENT:  Head:    Cardiovascular: Normal rate.   Pulmonary/Chest: Effort normal.  Neurological: She is alert and oriented to person, place, and time.  Skin: Skin is warm and dry. No rash noted. No erythema. No pallor.      Assessment and Plan :     1. Superficial skin infection - Start oral antihistamine, printed prescription for Bactroban to apply 3 times a day for a week if her skin infection does not improve with just oral antihistamine.  2. Seborrheic keratosis - Counseled patient on possible diagnosis of seborrheic keratosis. I decided not to do a skin biopsy given that skin lesions on her face, recommended that she pursue her plan of getting evaluation with a dermatologist for definitive diagnosis. Patient agreed.  Alison Eagles, PA-C [facility] [phone] 05/20/2015 7:30 PM

## 2015-05-20 NOTE — Telephone Encounter (Signed)
Pt now has insurance and is needing to get a colonoscopy and a mammogram referral we did it for her before but she lost her insurance and never got these done

## 2015-05-22 NOTE — Telephone Encounter (Signed)
Can we order these tests. If so, what diagnosis codes should we use? Please advise

## 2015-05-22 NOTE — Telephone Encounter (Signed)
Left message letting pt know. 

## 2015-05-22 NOTE — Telephone Encounter (Signed)
Tests were ordered.

## 2015-05-25 ENCOUNTER — Other Ambulatory Visit: Payer: Self-pay

## 2015-05-25 DIAGNOSIS — Z1231 Encounter for screening mammogram for malignant neoplasm of breast: Secondary | ICD-10-CM

## 2015-06-21 ENCOUNTER — Ambulatory Visit (INDEPENDENT_AMBULATORY_CARE_PROVIDER_SITE_OTHER): Payer: BC Managed Care – PPO | Admitting: Physician Assistant

## 2015-06-21 VITALS — BP 116/70 | HR 80 | Temp 98.9°F | Resp 16 | Ht 67.0 in | Wt 175.0 lb

## 2015-06-21 DIAGNOSIS — J069 Acute upper respiratory infection, unspecified: Secondary | ICD-10-CM | POA: Diagnosis not present

## 2015-06-21 DIAGNOSIS — B9789 Other viral agents as the cause of diseases classified elsewhere: Principal | ICD-10-CM

## 2015-06-21 MED ORDER — HYDROCODONE-HOMATROPINE 5-1.5 MG/5ML PO SYRP: 5.0000 mL | ORAL_SOLUTION | Freq: Two times a day (BID) | ORAL | Status: DC | PRN

## 2015-06-21 NOTE — Progress Notes (Signed)
Patient ID: Alison Kelley, female    DOB: 1961/06/02, 54 y.o.   MRN: [id number]  PCP: Kennon Portela, MD  Subjective:   Chief Complaint  Patient presents with  . Cough    x 1 week    HPI Presents for evaluation of cough. She is concerned that she has strep throat.  Had a cold a week ago. Began with headache and tiredness, cough.  It resolved, then returned "with a vengeance" on Friday 06/19/2015. This morning was gagging on phlegm that she couldn't cough up. It felt weird in her throat. Headache. General tiredness.  Has nasal congestion and drainage.  Greenish colored nasal drainage.   No fever or chills. No muscle or joint pain. No nausea, vomiting, diarrhea or abdominal pain. No known strep throat contacts.    Review of Systems  Constitutional: Positive for fatigue. Negative for fever, chills, diaphoresis and activity change.  HENT: Positive for congestion, postnasal drip, rhinorrhea and sore throat. Negative for dental problem, ear pain, facial swelling, mouth sores, nosebleeds, sinus pressure, sneezing, trouble swallowing and voice change.   Eyes: Negative for pain and discharge.  Respiratory: Positive for cough. Negative for choking, chest tightness, shortness of breath and wheezing.   Cardiovascular: Negative for chest pain and palpitations.  Gastrointestinal: Negative for nausea, vomiting and diarrhea.  Musculoskeletal: Negative for myalgias, back pain, arthralgias and gait problem.  Neurological: Positive for headaches. Negative for dizziness, weakness and light-headedness.  Hematological: Negative for adenopathy.       Patient Active Problem List   Diagnosis Date Noted  . FATIGUE, CHRONIC 11/12/2008  . HYPERLIPIDEMIA 04/27/2007  . DEPRESSION 04/27/2007  . ADHD 04/27/2007  . FIBROCYSTIC BREAST DISEASE 04/27/2007     Prior to Admission medications   Medication Sig Start Date End Date Taking? Authorizing Provider  buPROPion (WELLBUTRIN XL) 300  MG 24 hr tablet Take 1 tablet (300 mg total) by mouth daily. 10/04/14  Yes Robyn Haber, MD  mupirocin ointment (BACTROBAN) 2 % Apply 1 application topically 3 (three) times daily. 05/20/15  Yes Jaynee Eagles, PA-C  sertraline (ZOLOFT) 100 MG tablet Take 1 tablet (100 mg total) by mouth daily. 10/04/14  Yes Robyn Haber, MD  simvastatin (ZOCOR) 20 MG tablet Take 1 tablet (20 mg total) by mouth at bedtime. 10/04/14  Yes Robyn Haber, MD     No Known Allergies     Objective:  Physical Exam  Constitutional: She is oriented to person, place, and time. Vital signs are normal. She appears well-developed and well-nourished. No distress.  BP 116/70 mmHg  Pulse 80  Temp(Src) 98.9 F (37.2 C) (Oral)  Resp 16  Ht 5\' 7"  (1.702 m)  Wt 175 lb (79.379 kg)  BMI 27.40 kg/m2  SpO2 98%   HENT:  Head: Normocephalic and atraumatic.  Right Ear: Hearing, tympanic membrane, external ear and ear canal normal.  Left Ear: Hearing, tympanic membrane, external ear and ear canal normal.  Nose: Mucosal edema and rhinorrhea present.  No foreign bodies. Right sinus exhibits no maxillary sinus tenderness and no frontal sinus tenderness. Left sinus exhibits no maxillary sinus tenderness and no frontal sinus tenderness.  Mouth/Throat: Uvula is midline, oropharynx is clear and moist and mucous membranes are normal. No uvula swelling. No oropharyngeal exudate.  Eyes: Conjunctivae and EOM are normal. Pupils are equal, round, and reactive to light. Right eye exhibits no discharge. Left eye exhibits no discharge. No scleral icterus.  Neck: Trachea normal, normal range of motion and full passive range of motion  without pain. Neck supple. No thyroid mass and no thyromegaly present.  Cardiovascular: Normal rate, regular rhythm and normal heart sounds.   Pulmonary/Chest: Effort normal and breath sounds normal.  Lymphadenopathy:       Head (right side): No submandibular, no tonsillar, no preauricular, no posterior auricular  and no occipital adenopathy present.       Head (left side): No submandibular, no tonsillar, no preauricular and no occipital adenopathy present.    She has no cervical adenopathy.       Right: No supraclavicular adenopathy present.       Left: No supraclavicular adenopathy present.  Neurological: She is alert and oriented to person, place, and time. She has normal strength. No cranial nerve deficit or sensory deficit.  Skin: Skin is warm, dry and intact. No rash noted.  Psychiatric: She has a normal mood and affect. Her speech is normal and behavior is normal.           Assessment & Plan:   1. Viral URI with cough Unlikely to be strep throat, given her current symptoms. Supportive care. Guaifenesin. Tussionex. - HYDROcodone-homatropine (HYCODAN) 5-1.5 MG/5ML syrup; Take 5 mLs by mouth every 12 (twelve) hours as needed for cough.  Dispense: 100 mL; Refill: 0   Return if symptoms worsen or fail to improve.   Fara Chute, PA-C Physician Assistant-Certified [facility]

## 2015-06-21 NOTE — Patient Instructions (Signed)
PLEASE GET MUCINEX MAXIMUM STRENGTH YOU MAY USE ACETAMINOPHEN AND DIPHENHYDRAMINE  Get plenty of rest and drink at least 64 ounces of water daily.

## 2015-08-07 ENCOUNTER — Encounter: Payer: Self-pay | Admitting: Internal Medicine

## 2015-09-17 ENCOUNTER — Other Ambulatory Visit: Payer: Self-pay

## 2015-09-17 DIAGNOSIS — E755 Other lipid storage disorders: Secondary | ICD-10-CM

## 2015-09-17 MED ORDER — SIMVASTATIN 20 MG PO TABS: 20.0000 mg | ORAL_TABLET | Freq: Every day | ORAL | Status: DC

## 2015-09-21 ENCOUNTER — Ambulatory Visit
Admission: RE | Admit: 2015-09-21 | Discharge: 2015-09-21 | Disposition: A | Discharge status: Home / Self Care | Payer: BC Managed Care – PPO | Source: Ambulatory Visit | Attending: Physician Assistant | Admitting: Physician Assistant

## 2015-09-21 DIAGNOSIS — Z1239 Encounter for other screening for malignant neoplasm of breast: Secondary | ICD-10-CM

## 2015-10-12 ENCOUNTER — Encounter: Payer: Commercial Managed Care - PPO | Admitting: Internal Medicine

## 2015-12-16 ENCOUNTER — Ambulatory Visit (INDEPENDENT_AMBULATORY_CARE_PROVIDER_SITE_OTHER): Payer: BC Managed Care – PPO | Admitting: Family Medicine

## 2015-12-16 VITALS — BP 126/80 | HR 78 | Temp 98.1°F | Resp 18 | Ht 66.0 in | Wt 170.0 lb

## 2015-12-16 DIAGNOSIS — F329 Major depressive disorder, single episode, unspecified: Secondary | ICD-10-CM | POA: Diagnosis not present

## 2015-12-16 DIAGNOSIS — E785 Hyperlipidemia, unspecified: Secondary | ICD-10-CM

## 2015-12-16 DIAGNOSIS — Z23 Encounter for immunization: Secondary | ICD-10-CM | POA: Diagnosis not present

## 2015-12-16 DIAGNOSIS — Z0000 Encounter for general adult medical examination without abnormal findings: Secondary | ICD-10-CM | POA: Diagnosis not present

## 2015-12-16 DIAGNOSIS — F32A Depression, unspecified: Secondary | ICD-10-CM

## 2015-12-16 LAB — POCT URINALYSIS DIP (MANUAL ENTRY)
Bilirubin, UA: NEGATIVE
Blood, UA: NEGATIVE
Glucose, UA: NEGATIVE
Ketones, POC UA: NEGATIVE
Nitrite, UA: NEGATIVE
Protein Ur, POC: NEGATIVE
Spec Grav, UA: <=1.005
Urobilinogen, UA: 0.2
pH, UA: 6

## 2015-12-16 NOTE — Progress Notes (Signed)
Patient ID: Alison Kelley MRN: [id number], DOB: 04-23-1961, 55 y.o. Date of Encounter: 12/16/2015, 3:52 PM  Primary Physician: Kennon Portela, MD  Chief Complaint: Physical (CPE)  HPI: 55 y.o. y/o female with history of noted below here for CPE.  Doing well. No issues/complaints.  Now on a vegan diet since January and has stopped her simvastatin. She went back in the morning which is fasting to see what her cholesterol is doing.  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  Has Mirena IUD Pap:  No h/o abnormal paps MMG:2017 Last Td:2007 Review of Systems: Consitutional: No fever, chills, fatigue, night sweats, lymphadenopathy, or weight changes. Eyes: No visual changes, eye redness, or discharge. ENT/Mouth: Ears: No otalgia, tinnitus, hearing loss, discharge. Nose: No congestion, rhinorrhea, sinus pain, or epistaxis. Throat: No sore throat, post nasal drip, or teeth pain. Cardiovascular: No CP, palpitations, diaphoresis, DOE, edema, orthopnea, PND. Respiratory: No cough, hemoptysis, SOB, or wheezing. Gastrointestinal: No anorexia, dysphagia, reflux, pain, nausea, vomiting, hematemesis, diarrhea, constipation, BRBPR, or melena. Breast: No discharge, pain, swelling, or mass. Genitourinary: No dysuria, frequency, urgency, hematuria, incontinence, nocturia, amenorrhea, vaginal discharge, pruritis, burning, abnormal bleeding, or pain. Musculoskeletal: No decreased ROM, myalgias, stiffness, joint swelling, or weakness. Skin: No rash, erythema, lesion changes, pain, warmth, jaundice, or pruritis. Neurological: No headache, dizziness, syncope, seizures, tremors, memory loss, coordination problems, or paresthesias. Psychological: No anxiety, depression, hallucinations, SI/HI. Endocrine: No fatigue, polydipsia, polyphagia, polyuria, or known diabetes. All other systems were reviewed and are  otherwise negative.  Past Medical History  Diagnosis Date  . Depression   . High cholesterol   . Anxiety      Past Surgical History  Procedure Laterality Date  . Dilation and curettage of uterus    . Cesarean section      Home Meds:  Prior to Admission medications   Medication Sig Start Date End Date Taking? Authorizing Provider  buPROPion (WELLBUTRIN XL) 300 MG 24 hr tablet Take 1 tablet (300 mg total) by mouth daily. 10/04/14  Yes Robyn Haber, MD  sertraline (ZOLOFT) 100 MG tablet Take 1 tablet (100 mg total) by mouth daily. 10/04/14  Yes Robyn Haber, MD  simvastatin (ZOCOR) 20 MG tablet Take 1 tablet (20 mg total) by mouth at bedtime. 09/17/15  Yes Robyn Haber, MD  mupirocin ointment (BACTROBAN) 2 % Apply 1 application topically 3 (three) times daily. Patient not taking: Reported on 12/16/2015 05/20/15   Jaynee Eagles, PA-C    Allergies: No Known Allergies  Social History   Social History  . Marital Status: Married    Spouse Name: N/A  . Number of Children: N/A  . Years of Education: N/A   Occupational History  . Not on file.   Social History Main Topics  . Smoking status: Never Smoker   . Smokeless tobacco: Never Used  . Alcohol Use: Yes     Comment: rarely wine  . Drug Use: No  . Sexual Activity: Yes    Birth Control/ Protection: IUD   Other Topics Concern  . Not on file   Social History Narrative    Family History  Problem Relation Age of Onset  . Cancer Father   . Uterine cancer Mother   . Breast cancer Sister   . Cancer Sister   . Depression Son   . ADD / ADHD Son   . ADD / ADHD Son   . Cancer  Maternal Uncle     Physical Exam: Blood pressure 126/80, pulse 78, temperature 98.1 F (36.7 C), temperature source Oral, resp. rate 18, height 5\' 6"  (1.676 m), weight 170 lb (77.111 kg), SpO2 98 %., Body mass index is 27.45 kg/(m^2). Wt Readings from Last 3 Encounters:  12/16/15 170 lb (77.111 kg)  06/21/15 175 lb (79.379 kg)  05/20/15 172 lb  12.8 oz (78.382 kg)   BP Readings from Last 3 Encounters:  12/16/15 126/80  06/21/15 116/70  05/20/15 114/80   General: Well developed, well nourished, in no acute distress. HEENT: Normocephalic, atraumatic. Conjunctiva pink, sclera non-icteric. Pupils 2 mm constricting to 1 mm, round, regular, and equally reactive to light and accomodation. EOMI. Fundi benign   Internal auditory canal clear. TMs with good cone of light and without pathology. Nasal mucosa pink. Nares are without discharge. No sinus tenderness. Oral mucosa pink. Dentition good. Pharynx without exudate.    Neck: Supple. Trachea midline. No thyromegaly. Full ROM. No lymphadenopathy. Lungs: Clear to auscultation bilaterally without wheezes, rales, or rhonchi. Breathing is of normal effort and unlabored. Cardiovascular: RRR with S1 S2. No murmurs, rubs, or gallops appreciated. Distal pulses 2+ symmetrically. No carotid or abdominal bruits. Breast: Symmetrical. No masses. Nipples without discharge. Abdomen: Soft, non-tender, non-distended with normoactive bowel sounds. No hepatosplenomegaly or masses. No rebound/guarding. No CVA tenderness. Without hernias.  Musculoskeletal: Full range of motion and 5/5 strength throughout. Without swelling, atrophy, tenderness, crepitus, or warmth. Extremities without clubbing, cyanosis, or edema. Calves supple. Skin: Warm and moist without erythema, ecchymosis, wounds, or rash. Neuro: A+Ox3. CN II-XII grossly intact. Moves all extremities spontaneously. Full sensation throughout. Normal gait. DTR 2+ throughout upper and lower extremities. Finger to nose intact. Psych:  Responds to questions appropriately with a normal affect.   Lab Results  Component Value Date   CHOL 275* 10/04/2014   HDL 72 10/04/2014   LDLCALC 186* 10/04/2014   LDLDIRECT 172.5 10/30/2009   TRIG 84 10/04/2014   CHOLHDL 3.8 10/04/2014    Assessment/Plan:  55 y.o. y/o female here for CPE   ICD-9-CM ICD-10-CM   1.  Annual physical exam V70.0 Z00.00 POCT urinalysis dipstick     COMPLETE METABOLIC PANEL WITH GFR     POCT CBC     FSH/LH     CANCELED: POCT CBC     CANCELED: COMPLETE METABOLIC PANEL WITH GFR     CANCELED: Lipid panel  2. Hyperlipidemia 272.4 E78.5 Lipid panel     TSH  3. Depression 311 F32.9 TSH   -  Signed, Robyn Haber, MD 12/16/2015 3:52 PM

## 2015-12-16 NOTE — Patient Instructions (Addendum)

## 2015-12-19 ENCOUNTER — Other Ambulatory Visit (INDEPENDENT_AMBULATORY_CARE_PROVIDER_SITE_OTHER): Payer: BC Managed Care – PPO | Admitting: Family Medicine

## 2015-12-19 ENCOUNTER — Other Ambulatory Visit (HOSPITAL_COMMUNITY): Payer: Self-pay | Admitting: Psychiatry

## 2015-12-19 DIAGNOSIS — E785 Hyperlipidemia, unspecified: Secondary | ICD-10-CM

## 2015-12-19 DIAGNOSIS — F329 Major depressive disorder, single episode, unspecified: Secondary | ICD-10-CM

## 2015-12-19 DIAGNOSIS — F32A Depression, unspecified: Secondary | ICD-10-CM

## 2015-12-19 DIAGNOSIS — Z0000 Encounter for general adult medical examination without abnormal findings: Secondary | ICD-10-CM

## 2015-12-19 LAB — COMPLETE METABOLIC PANEL WITH GFR
ALT: 50 U/L — ABNORMAL HIGH (ref 6–29)
AST: 78 U/L — ABNORMAL HIGH (ref 10–35)
Albumin: 4.2 g/dL (ref 3.6–5.1)
Alkaline Phosphatase: 85 U/L (ref 33–130)
BUN: 8 mg/dL (ref 7–25)
CO2: 26 mmol/L (ref 20–31)
Calcium: 9.3 mg/dL (ref 8.6–10.4)
Chloride: 103 mmol/L (ref 98–110)
Creat: 0.92 mg/dL (ref 0.50–1.05)
GFR, Est African American: 82 mL/min (ref >=60)
GFR, Est Non African American: 71 mL/min (ref >=60)
Glucose, Bld: 89 mg/dL (ref 65–99)
Potassium: 4.6 mmol/L (ref 3.5–5.3)
Sodium: 139 mmol/L (ref 135–146)
Total Bilirubin: 0.5 mg/dL (ref 0.2–1.2)
Total Protein: 6.7 g/dL (ref 6.1–8.1)

## 2015-12-19 LAB — LIPID PANEL
Cholesterol: 250 mg/dL — ABNORMAL HIGH (ref 125–200)
HDL: 54 mg/dL (ref >=46)
LDL Cholesterol: 175 mg/dL — ABNORMAL HIGH (ref <130)
Total CHOL/HDL Ratio: 4.6 Ratio (ref <=5.0)
Triglycerides: 106 mg/dL (ref <150)
VLDL: 21 mg/dL (ref <30)

## 2015-12-19 LAB — FSH/LH
FSH: 133.9 m[IU]/mL — ABNORMAL HIGH
LH: 76.7 m[IU]/mL — ABNORMAL HIGH

## 2015-12-19 LAB — TSH: TSH: 1.3 mIU/L

## 2015-12-21 ENCOUNTER — Ambulatory Visit (INDEPENDENT_AMBULATORY_CARE_PROVIDER_SITE_OTHER): Payer: BC Managed Care – PPO | Admitting: Internal Medicine

## 2015-12-21 VITALS — BP 102/64 | HR 75 | Temp 98.2°F | Resp 18 | Ht 66.0 in | Wt 171.0 lb

## 2015-12-21 DIAGNOSIS — W5501XA Bitten by cat, initial encounter: Secondary | ICD-10-CM | POA: Diagnosis not present

## 2015-12-21 DIAGNOSIS — S61451A Open bite of right hand, initial encounter: Secondary | ICD-10-CM

## 2015-12-21 MED ORDER — AMOXICILLIN-POT CLAVULANATE 875-125 MG PO TABS: 1.0000 | ORAL_TABLET | Freq: Two times a day (BID) | ORAL | Status: DC

## 2015-12-21 NOTE — Progress Notes (Signed)
Subjective:  By signing my name below, I, Raven Small, attest that this documentation has been prepared under the direction and in the presence of Tami Lin, MD.  Electronically Signed: Thea Alken, ED Scribe. 12/21/2015. 10:15 AM.   Patient ID: Alison Kelley, female    DOB: Dec 17, 1960, 55 y.o.   MRN: [id number]  HPI Chief Complaint  Patient presents with  . Animal Bite    right hand, was bit by a cat x sat.    HPI Comments: Alison Kelley is a 55 y.o. female who presents to the [facility] complaining of animal bit to right hand. Pt states she was bit by another persons cat 3 days ago. She covered wound with band-aid. She was concerned for poss infection. Pt denies drug allergies.   Patient Active Problem List   Diagnosis Date Noted  . FATIGUE, CHRONIC 11/12/2008  . HYPERLIPIDEMIA 04/27/2007  . DEPRESSION 04/27/2007  . ADHD 04/27/2007  . FIBROCYSTIC BREAST DISEASE 04/27/2007   Past Medical History  Diagnosis Date  . Depression   . High cholesterol   . Anxiety    Past Surgical History  Procedure Laterality Date  . Dilation and curettage of uterus    . Cesarean section     No Known Allergies Prior to Admission medications   Medication Sig Start Date End Date Taking? Authorizing Provider  buPROPion (WELLBUTRIN XL) 300 MG 24 hr tablet Take 1 tablet (300 mg total) by mouth daily. 10/04/14  Yes Robyn Haber, MD  sertraline (ZOLOFT) 100 MG tablet Take 1 tablet (100 mg total) by mouth daily. 10/04/14  Yes Robyn Haber, MD  mupirocin ointment (BACTROBAN) 2 % Apply 1 application topically 3 (three) times daily. Patient not taking: Reported on 12/16/2015 05/20/15   Jaynee Eagles, PA-C   Social History   Social History  . Marital Status: Married    Spouse Name: N/A  . Number of Children: N/A  . Years of Education: N/A   Occupational History  . Not on file.   Social History Main Topics  . Smoking status: Never Smoker   . Smokeless tobacco:  Never Used  . Alcohol Use: Yes     Comment: rarely wine  . Drug Use: No  . Sexual Activity: Yes    Birth Control/ Protection: IUD   Other Topics Concern  . Not on file   Social History Narrative   Review of Systems  Musculoskeletal: Negative for myalgias.  Skin: Positive for wound. Negative for color change.    Objective:   Physical Exam  Constitutional: She is oriented to person, place, and time. She appears well-developed and well-nourished. No distress.  HENT:  Head: Normocephalic and atraumatic.  Eyes: Conjunctivae and EOM are normal.  Neck: Neck supple.  Cardiovascular: Normal rate.   Pulmonary/Chest: Effort normal.  Musculoskeletal: Normal range of motion.  Neurological: She is alert and oriented to person, place, and time.  Skin: Skin is warm and dry.  3 punctures wounds around right thenar eminence. No tendon involvement. Mild TTP.   Psychiatric: She has a normal mood and affect. Her behavior is normal.  Nursing note and vitals reviewed.  Filed Vitals:   12/21/15 1005  BP: 102/64  Pulse: 75  Temp: 98.2 F (36.8 C)  TempSrc: Oral  Resp: 18  Height: 5\' 6"  (1.676 m)  Weight: 171 lb (77.565 kg)  SpO2: 98%    Assessment & Plan:   1. Cat bite of hand, right, initial encounter     Hot  soaks Meds ordered this encounter  Medications  . amoxicillin-clavulanate (AUGMENTIN) 875-125 MG tablet    Sig: Take 1 tablet by mouth 2 (two) times daily.    Dispense:  20 tablet    Refill:  0    I have completed the patient encounter in its entirety as documented by the scribe, with editing by me where necessary. Rory Xiang P. Laney Pastor, M.D.

## 2015-12-21 NOTE — Patient Instructions (Signed)
     IF you received an x-ray today, you will receive an invoice from [facility]. Please contact [facility] at [phone] with questions or concerns regarding your invoice.   IF you received labwork today, you will receive an invoice from [facility]. Please contact [facility] at [phone] with questions or concerns regarding your invoice.   Our billing staff will not be able to assist you with questions regarding bills from these companies.  You will be contacted with the lab results as soon as they are available. The fastest way to get your results is to activate your My Chart account. Instructions are located on the last page of this paperwork. If you have not heard from us regarding the results in 2 weeks, please contact this office.      

## 2016-01-02 ENCOUNTER — Telehealth: Payer: Self-pay

## 2016-01-02 NOTE — Telephone Encounter (Signed)
IC pt with results note per Dr. Joseph Art.  Pt will return end of May to June for LFT's and in late August for Cholesterol check. States she has been doing vegan diet since January and will continue.  Pt asked about LFT's - advised limiting Tylenol, good hydration, limiting diet sodas due to artificial sweeteners (suggested she google artificial sweeteners for more info).

## 2016-01-02 NOTE — Telephone Encounter (Signed)
-----   Message from Robyn Haber, MD sent at 12/20/2015 10:08 AM EDT ----- Patient has abnormal lab values.  Alison Kelley is postmenopausal.  Her cholesterol is elevated which often happens with menopause and can be lowered with exercise and green vegetables.  We should recheck the cholesterol after summer.  There is some mild elevation of the liver enzymes.  This is not serious and should be rechecked in one to two months.  Thyroid is normal

## 2016-01-18 ENCOUNTER — Ambulatory Visit (INDEPENDENT_AMBULATORY_CARE_PROVIDER_SITE_OTHER): Payer: BC Managed Care – PPO | Admitting: Physician Assistant

## 2016-01-18 VITALS — BP 112/76 | HR 65 | Temp 98.1°F | Resp 15 | Ht 66.0 in | Wt 168.4 lb

## 2016-01-18 DIAGNOSIS — Z113 Encounter for screening for infections with a predominantly sexual mode of transmission: Secondary | ICD-10-CM | POA: Diagnosis not present

## 2016-01-18 DIAGNOSIS — L989 Disorder of the skin and subcutaneous tissue, unspecified: Secondary | ICD-10-CM | POA: Diagnosis not present

## 2016-01-18 DIAGNOSIS — R945 Abnormal results of liver function studies: Secondary | ICD-10-CM

## 2016-01-18 DIAGNOSIS — R7989 Other specified abnormal findings of blood chemistry: Secondary | ICD-10-CM | POA: Diagnosis not present

## 2016-01-18 NOTE — Patient Instructions (Signed)
     IF you received an x-ray today, you will receive an invoice from [facility]. Please contact [facility] at [phone] with questions or concerns regarding your invoice.   IF you received labwork today, you will receive an invoice from [facility]. Please contact [facility] at [phone] with questions or concerns regarding your invoice.   Our billing staff will not be able to assist you with questions regarding bills from these companies.  You will be contacted with the lab results as soon as they are available. The fastest way to get your results is to activate your My Chart account. Instructions are located on the last page of this paperwork. If you have not heard from us regarding the results in 2 weeks, please contact this office.      

## 2016-01-18 NOTE — Progress Notes (Signed)
Patient ID: Alison Kelley, female    DOB: [birth year], 55 y.o.   MRN: [id number]  PCP: Kennon Portela, MD  Subjective:   Chief Complaint  Patient presents with  . STD screening    and would like liver enzyme check  . FEMALE ONLY    Per patient given issues feel more comfortable seeing a FEMALE    HPI Presents for evaluation of an irritated area around the anus, which concerns her as a possible STI, and for repeat labs due to recent elevated LFTs.  She has had a place at the anus for several days. She is worried that it could represent an infection. She describes her husband as a very kind and nice person who is very good to her. She worries that she does not satisfy his adequately. She knows he views porn, "which doesn't bother me, actually," but she wonders if he might have other partners. "I'm probably just paranoid."  Since they have been together (x8.5 years) she has had no other partners. She was previously married, and in between the marriages there were 4 other partners.   No history of STI. She was tested before starting her current relationship.    Review of Systems As above, otherwise asymptomatic.    Patient Active Problem List   Diagnosis Date Noted  . FATIGUE, CHRONIC 11/12/2008  . HYPERLIPIDEMIA 04/27/2007  . DEPRESSION 04/27/2007  . ADHD 04/27/2007  . FIBROCYSTIC BREAST DISEASE 04/27/2007     Prior to Admission medications   Medication Sig Start Date End Date Taking? Authorizing Provider  buPROPion (WELLBUTRIN XL) 300 MG 24 hr tablet Take 1 tablet (300 mg total) by mouth daily. 10/04/14  Yes Robyn Haber, MD  sertraline (ZOLOFT) 100 MG tablet Take 1 tablet (100 mg total) by mouth daily. 10/04/14  Yes Robyn Haber, MD     No Known Allergies     Objective:  Physical Exam  Constitutional: She is oriented to person, place, and time. She appears well-developed and well-nourished. She is active and cooperative. No distress.  BP 112/76 mmHg   Pulse 65  Temp(Src) 98.1 F (36.7 C) (Oral)  Resp 15  Ht 5\' 6"  (1.676 m)  Wt 168 lb 6.4 oz (76.386 kg)  BMI 27.19 kg/m2  SpO2 98%   Eyes: Conjunctivae are normal.  Pulmonary/Chest: Effort normal.  Abdominal: Hernia confirmed negative in the right inguinal area and confirmed negative in the left inguinal area.  Genitourinary: Rectal exam shows external hemorrhoid. Rectal exam shows no tenderness.    No labial fusion. There is no rash, tenderness, lesion or injury on the right labia. There is no rash, tenderness, lesion or injury on the left labia.  There is some nonspecific hyperpigmentation of the labia minora. The skin surrounding the anus is hypopigmented and there are a few scattered shallow fissures which are not tender. At the anus there is purplish hued tissue consistent with hemorrhoids, though there is no tenderness, induration or bleeding.   Lymphadenopathy:       Right: No inguinal adenopathy present.       Left: No inguinal adenopathy present.  Neurological: She is alert and oriented to person, place, and time.  Psychiatric: She has a normal mood and affect. Her speech is normal and behavior is normal.           Assessment & Plan:   1. Skin lesion No lesions suspicious for STI. The fissures are really in the skin of the buttock, not the anal tissue. She  has a history of hemorrhoids, but no pain, itching x years. Advise baby diaper ointment/cream as a protectant, and otherwise helping to keep the are dry.  2. Routine screening for STI (sexually transmitted infection) Await lab results. I am more concerned about her gut feeling of concern, and encourage her to discuss it with her husband. - GC/Chlamydia Probe Amp - HIV antibody - RPR - Hepatitis B surface antibody - Hepatitis B surface antigen - Hepatitis C antibody  3. Elevated LFTs Await lab results. Additional evaluation/follow-up to be determined by results. - Hepatic function panel   Fara Chute, PA-C Physician Assistant-Certified [facility]

## 2016-01-19 LAB — HEPATIC FUNCTION PANEL
ALBUMIN: 4.3 g/dL (ref 3.6–5.1)
ALT: 27 U/L (ref 6–29)
AST: 29 U/L (ref 10–35)
Alkaline Phosphatase: 83 U/L (ref 33–130)
BILIRUBIN TOTAL: 0.4 mg/dL (ref 0.2–1.2)
Bilirubin, Direct: 0.1 mg/dL (ref <=0.2)
Indirect Bilirubin: 0.3 mg/dL (ref 0.2–1.2)
TOTAL PROTEIN: 6.9 g/dL (ref 6.1–8.1)

## 2016-01-19 LAB — HEPATITIS B SURFACE ANTIBODY, QUANTITATIVE: Hepatitis B-Post: 0 m[IU]/mL

## 2016-01-19 LAB — HEPATITIS C ANTIBODY: HCV AB: NEGATIVE

## 2016-01-19 LAB — HIV ANTIBODY (ROUTINE TESTING W REFLEX): HIV: NONREACTIVE

## 2016-01-19 LAB — HEPATITIS B SURFACE ANTIGEN: HEP B S AG: NEGATIVE

## 2016-01-20 LAB — GC/CHLAMYDIA PROBE AMP
CT PROBE, AMP APTIMA: NOT DETECTED
GC PROBE AMP APTIMA: NOT DETECTED

## 2016-01-20 LAB — RPR

## 2016-01-28 ENCOUNTER — Encounter: Payer: Self-pay | Admitting: Internal Medicine

## 2016-02-25 ENCOUNTER — Ambulatory Visit (AMBULATORY_SURGERY_CENTER): Payer: Self-pay

## 2016-02-25 VITALS — Ht 66.0 in | Wt 173.0 lb

## 2016-02-25 DIAGNOSIS — Z1211 Encounter for screening for malignant neoplasm of colon: Secondary | ICD-10-CM

## 2016-02-25 MED ORDER — NA SULFATE-K SULFATE-MG SULF 17.5-3.13-1.6 GM/177ML PO SOLN: 1.0000 | Freq: Once | ORAL | Status: DC

## 2016-02-25 NOTE — Progress Notes (Signed)
No egg or soy allergy.  No previous complications from anesthesia. No home O2. No diet meds. Emmi video sent to: [email or web address]

## 2016-02-29 ENCOUNTER — Encounter: Payer: Self-pay | Admitting: Internal Medicine

## 2016-03-10 ENCOUNTER — Encounter: Payer: Self-pay | Admitting: Internal Medicine

## 2016-04-14 ENCOUNTER — Ambulatory Visit (AMBULATORY_SURGERY_CENTER): Payer: BC Managed Care – PPO | Admitting: Gastroenterology

## 2016-04-14 ENCOUNTER — Encounter: Payer: Self-pay | Admitting: Gastroenterology

## 2016-04-14 VITALS — BP 103/67 | HR 62 | Temp 97.8°F | Resp 13 | Ht 66.0 in | Wt 173.0 lb

## 2016-04-14 DIAGNOSIS — Z1211 Encounter for screening for malignant neoplasm of colon: Secondary | ICD-10-CM | POA: Diagnosis present

## 2016-04-14 DIAGNOSIS — D128 Benign neoplasm of rectum: Secondary | ICD-10-CM

## 2016-04-14 MED ORDER — SODIUM CHLORIDE 0.9 % IV SOLN: 500.0000 mL | INTRAVENOUS | Status: DC

## 2016-04-14 NOTE — Progress Notes (Signed)
No problems noted in the recovery room. mawNo problems noted in the recovery room. maw

## 2016-04-14 NOTE — Op Note (Signed)
[facility] Patient Name: Alison Kelley Procedure Date: 04/14/2016 1:18 PM MRN: [id number] Endoscopist: Remo Lipps P. Havery Moros , MD Age: 55 Referring MD:  Date of Birth: 1961/01/10 Gender: Female Account #: [id number] Procedure:                Colonoscopy Indications:              Screening for malignant neoplasm in the colon, This                            is the patient's first colonoscopy Medicines:                Monitored Anesthesia Care Procedure:                Pre-Anesthesia Assessment:                           - Prior to the procedure, a History and Physical                            was performed, and patient medications and                            allergies were reviewed. The patient's tolerance of                            previous anesthesia was also reviewed. The risks                            and benefits of the procedure and the sedation                            options and risks were discussed with the patient.                            All questions were answered, and informed consent                            was obtained. Prior Anticoagulants: The patient has                            taken no previous anticoagulant or antiplatelet                            agents. ASA Grade Assessment: II - A patient with                            mild systemic disease. After reviewing the risks                            and benefits, the patient was deemed in                            satisfactory condition to undergo the procedure.  After obtaining informed consent, the colonoscope                            was passed under direct vision. Throughout the                            procedure, the patient's blood pressure, pulse, and                            oxygen saturations were monitored continuously. The                            Model PCF-H190DL [id number]) scope was introduced                            through the  anus and advanced to the the cecum,                            identified by appendiceal orifice and ileocecal                            valve. The colonoscopy was performed without                            difficulty. The patient tolerated the procedure                            well. The quality of the bowel preparation was                            adequate. The ileocecal valve, appendiceal orifice,                            and rectum were photographed. Scope In: 1:28:32 PM Scope Out: 1:52:08 PM Scope Withdrawal Time: 0 hours 15 minutes 53 seconds  Total Procedure Duration: 0 hours 23 minutes 36 seconds  Findings:                 The perianal and digital rectal examinations were                            normal.                           The colon was significantly tortuous requiring use                            of pediatric colonoscope.                           A 3 mm polyp was found in the rectum. The polyp was                            sessile. The polyp was removed with a cold biopsy  forceps. Resection and retrieval were complete.                           The exam was otherwise normal throughout the                            examined colon. Complications:            No immediate complications. Estimated blood loss:                            Minimal. Estimated Blood Loss:     Estimated blood loss was minimal. Impression:               - Tortuous colon.                           - One 3 mm polyp in the rectum, removed with a cold                            biopsy forceps. Resected and retrieved.                           - Otherwise normal colon. Recommendation:           - Patient has a contact number available for                            emergencies. The signs and symptoms of potential                            delayed complications were discussed with the                            patient. Return to normal activities tomorrow.                             Written discharge instructions were provided to the                            patient.                           - Resume previous diet.                           - Continue present medications.                           - Await pathology results.                           - Repeat colonoscopy is recommended for                            surveillance. The colonoscopy date will be  determined after pathology results from today's                            exam become available for review. Remo Lipps P. Havery Moros, MD 04/14/2016 2:00:28 PM This report has been signed electronically.

## 2016-04-14 NOTE — Patient Instructions (Signed)

## 2016-04-14 NOTE — Progress Notes (Signed)
Report to PACU, RN, vss, BBS= Clear.  

## 2016-04-14 NOTE — Progress Notes (Signed)
Called to room to assist during endoscopic procedure.  Patient ID and intended procedure confirmed with present staff. Received instructions for my participation in the procedure from the performing physician.  

## 2016-04-14 NOTE — Progress Notes (Signed)
No problems noted in the recovery room. maw 

## 2016-04-15 ENCOUNTER — Telehealth: Payer: Self-pay

## 2016-04-15 NOTE — Telephone Encounter (Signed)
  Follow up Call-  Call back number 04/14/2016  Post procedure Call Back phone  # [phone]  Permission to leave phone message Yes  Some recent data might be hidden    Patient states the staff was wonderful, and the staff made her experience in the endoscopy center very pleasant.    Patient questions:  Do you have a fever, pain , or abdominal swelling? No. Pain Score  0 *  Have you tolerated food without any problems? Yes.    Have you been able to return to your normal activities? Yes.    Do you have any questions about your discharge instructions: Diet   No. Medications  No. Follow up visit  No.  Do you have questions or concerns about your Care? No.  Actions: * If pain score is 4 or above: No action needed, pain <4.

## 2016-04-19 ENCOUNTER — Encounter: Payer: Self-pay | Admitting: Gastroenterology

## 2016-04-19 ENCOUNTER — Encounter: Payer: BC Managed Care – PPO | Admitting: Gastroenterology

## 2016-07-04 ENCOUNTER — Telehealth: Payer: Self-pay | Admitting: Family Medicine

## 2016-07-04 NOTE — Telephone Encounter (Signed)
Pt called wanting to find out if she can still walk in for LFT and I told her that DR Joseph Art did say she was going to come back in to get that test done the end of August and that she need a OV for med refill on Sertraline

## 2016-07-21 ENCOUNTER — Ambulatory Visit (INDEPENDENT_AMBULATORY_CARE_PROVIDER_SITE_OTHER): Payer: BC Managed Care – PPO | Admitting: Urgent Care

## 2016-07-21 VITALS — BP 130/84 | HR 71 | Temp 97.8°F | Resp 17 | Ht 66.0 in | Wt 174.0 lb

## 2016-07-21 DIAGNOSIS — F3289 Other specified depressive episodes: Secondary | ICD-10-CM | POA: Diagnosis not present

## 2016-07-21 DIAGNOSIS — F418 Other specified anxiety disorders: Secondary | ICD-10-CM

## 2016-07-21 DIAGNOSIS — F329 Major depressive disorder, single episode, unspecified: Secondary | ICD-10-CM

## 2016-07-21 DIAGNOSIS — F419 Anxiety disorder, unspecified: Principal | ICD-10-CM

## 2016-07-21 MED ORDER — BUPROPION HCL ER (XL) 300 MG PO TB24: 300.0000 mg | ORAL_TABLET | Freq: Every day | ORAL | 3 refills | Status: AC

## 2016-07-21 MED ORDER — SERTRALINE HCL 100 MG PO TABS: 100.0000 mg | ORAL_TABLET | Freq: Every day | ORAL | 3 refills | Status: DC

## 2016-07-21 NOTE — Progress Notes (Signed)
    MRN: [id number] DOB: 06/26/1961  Subjective:   Alison Kelley is a 55 y.o. female presenting for chief complaint of Medication Refill (Would like brand name Wellbutrin and prozac instead of zoloft)  Depression and Anxiety - Managed with Zoloft and Wellbutrin XL. She sees Dr. Casimiro Needle for psychiatry and Burnard Leigh for therapy. Patient has longstanding history of anxiety and severe depression. She has tried multiple medications in the past including Pristiq, Haldol, tegretol. She has had SI, HI in the distant past but denies any of this today. 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 Has had a lot of stressors with her mother's health, finishing her Master's degree, entering her new job, various transitions with her staff at school. She also has significant stressors with her 2 sons who are on the same medication regimen as she is. Today, patient would like to discuss refilling her Wellbutrin XL for brand name only by her sister's recommendation. She would also like to discuss Lexapro versus Prozac. She denies worrying easily, becoming shaky, sweaty, feeling her heart race, becoming fearful. She does feel overwhelmed with all her stressors but overall feels like she is doing okay. Denies smoking cigarettes, has occasional glass of wine.  Alison Kelley has a current medication list which includes the following prescription(s): bupropion and sertraline. Also has No Known Allergies.  Alison Kelley  has a past medical history of Anemia; Anxiety; Arthritis [phone]); Depression; High cholesterol; and Seizures ([place]) (2010 ). Also  has a past surgical history that includes Dilation and curettage of uterus and Cesarean section.   Objective:   Vitals: BP 130/84 (BP Location: Right Arm, Patient Position: Sitting, Cuff Size: Normal)   Pulse 71   Temp 97.8 F (36.6 C) (Oral)   Resp 17   Ht 5\' 6"  (1.676 m)   Wt 174 lb (78.9 kg)   SpO2 97%    BMI 28.08 kg/m   Physical Exam  Constitutional: She is oriented to person, place, and time. She appears well-developed and well-nourished.  Cardiovascular: Normal rate.   Pulmonary/Chest: Effort normal.  Neurological: She is alert and oriented to person, place, and time.  Psychiatric: Her mood appears not anxious. Her speech is not rapid and/or pressured, not delayed and not tangential. She is not agitated, not hyperactive, not slowed and not withdrawn. She does not exhibit a depressed mood. She expresses no homicidal and no suicidal ideation. She exhibits normal remote memory.   Assessment and Plan :   1. Anxiety and depression 2. Other depression - I spent approximately 45 minutes of face-to-face time, more than 50% of which we spent discussing medication options including risks and side effects of Wellbutrin, Lexapro, Prozac and switching therapies. Patient plans to continue follow up with Dr. Casimiro Needle, will continue to see Dr. Pamala Hurry. Follow up in 6 weeks, consider switching to Prozac from Lexapro. For now, patient will try brand name of Wellbutrin XL.  Jaynee Eagles, PA-C [facility] [phone] 07/21/2016 5:29 PM

## 2016-07-21 NOTE — Progress Notes (Signed)
1 

## 2016-07-21 NOTE — Patient Instructions (Addendum)
Bupropion extended-release tablets (Depression/Mood Disorders) What is this medicine? BUPROPION (byoo PROE pee on) is used to treat depression. This medicine may be used for other purposes; ask your health care provider or pharmacist if you have questions. COMMON BRAND NAME(S): Aplenzin, Budeprion XL, Forfivo XL, Wellbutrin XL What should I tell my health care provider before I take this medicine? They need to know if you have any of these conditions: -an eating disorder, such as anorexia or bulimia -bipolar disorder or psychosis -diabetes or high blood sugar, treated with medication -glaucoma -head injury or brain tumor -heart disease, previous heart attack, or irregular heart beat -high blood pressure -kidney or liver disease -seizures (convulsions) -suicidal thoughts or a previous suicide attempt -Tourette's syndrome -weight loss -an unusual or allergic reaction to bupropion, other medicines, foods, dyes, or preservatives -breast-feeding -pregnant or trying to become pregnant How should I use this medicine? Take this medicine by mouth with a glass of water. Follow the directions on the prescription label. You can take it with or without food. If it upsets your stomach, take it with food. Do not crush, chew, or cut these tablets. This medicine is taken once daily at the same time each day. Do not take your medicine more often than directed. Do not stop taking this medicine suddenly except upon the advice of your doctor. Stopping this medicine too quickly may cause serious side effects or your condition may worsen. A special MedGuide will be given to you by the pharmacist with each prescription and refill. Be sure to read this information carefully each time. Talk to your pediatrician regarding the use of this medicine in children. Special care may be needed. Overdosage: If you think you have taken too much of this medicine contact a poison control center or emergency room at once. NOTE:  This medicine is only for you. Do not share this medicine with others. What if I miss a dose? If you miss a dose, skip the missed dose and take your next tablet at the regular time. Do not take double or extra doses. What may interact with this medicine? Do not take this medicine with any of the following medications: -linezolid -MAOIs like Azilect, Carbex, Eldepryl, Marplan, Nardil, and Parnate -methylene blue (injected into a vein) -other medicines that contain bupropion like Zyban This medicine may also interact with the following medications: -alcohol -certain medicines for anxiety or sleep -certain medicines for blood pressure like metoprolol, propranolol -certain medicines for depression or psychotic disturbances -certain medicines for HIV or AIDS like efavirenz, lopinavir, nelfinavir, ritonavir -certain medicines for irregular heart beat like propafenone, flecainide -certain medicines for Parkinson's disease like amantadine, levodopa -certain medicines for seizures like carbamazepine, phenytoin, phenobarbital -cimetidine -clopidogrel -cyclophosphamide -digoxin -furazolidone -isoniazid -nicotine -orphenadrine -procarbazine -steroid medicines like prednisone or cortisone -stimulant medicines for attention disorders, weight loss, or to stay awake -tamoxifen -theophylline -thiotepa -ticlopidine -tramadol -warfarin This list may not describe all possible interactions. Give your health care provider a list of all the medicines, herbs, non-prescription drugs, or dietary supplements you use. Also tell them if you smoke, drink alcohol, or use illegal drugs. Some items may interact with your medicine. What should I watch for while using this medicine? Tell your doctor if your symptoms do not get better or if they get worse. Visit your doctor or health care professional for regular checks on your progress. Because it may take several weeks to see the full effects of this medicine, it  is important to continue your treatment as   prescribed by your doctor. Patients and their families should watch out for new or worsening thoughts of suicide or depression. Also watch out for sudden changes in feelings such as feeling anxious, agitated, panicky, irritable, hostile, aggressive, impulsive, severely restless, overly excited and hyperactive, or not being able to sleep. If this happens, especially at the beginning of treatment or after a change in dose, call your health care professional. Avoid alcoholic drinks while taking this medicine. Drinking large amounts of alcoholic beverages, using sleeping or anxiety medicines, or quickly stopping the use of these agents while taking this medicine may increase your risk for a seizure. Do not drive or use heavy machinery until you know how this medicine affects you. This medicine can impair your ability to perform these tasks. Do not take this medicine close to bedtime. It may prevent you from sleeping. Your mouth may get dry. Chewing sugarless gum or sucking hard candy, and drinking plenty of water may help. Contact your doctor if the problem does not go away or is severe. The tablet shell for some brands of this medicine does not dissolve. This is normal. The tablet shell may appear whole in the stool. This is not a cause for concern. What side effects may I notice from receiving this medicine? Side effects that you should report to your doctor or health care professional as soon as possible: -allergic reactions like skin rash, itching or hives, swelling of the face, lips, or tongue -breathing problems -changes in vision -confusion -elevated mood, decreased need for sleep, racing thoughts, impulsive behavior -fast or irregular heartbeat -hallucinations, loss of contact with reality -increased blood pressure -redness, blistering, peeling or loosening of the skin, including inside the mouth -seizures -suicidal thoughts or other mood  changes -unusually weak or tired -vomiting Side effects that usually do not require medical attention (report to your doctor or health care professional if they continue or are bothersome): -constipation -headache -loss of appetite -nausea -tremors -weight loss This list may not describe all possible side effects. Call your doctor for medical advice about side effects. You may report side effects to FDA at 1-800-FDA-1088. Where should I keep my medicine? Keep out of the reach of children. Store at room temperature between 15 and 30 degrees C (59 and 86 degrees F). Throw away any unused medicine after the expiration date. NOTE: This sheet is a summary. It may not cover all possible information. If you have questions about this medicine, talk to your doctor, pharmacist, or health care provider.  2017 Elsevier/Gold Standard (2016-02-12 13:55:13)     IF you received an x-ray today, you will receive an invoice from [facility]. Please contact [facility] at [phone] with questions or concerns regarding your invoice.   IF you received labwork today, you will receive an invoice from [employer]. Please contact [facility] at [phone] with questions or concerns regarding your invoice.   Our billing staff will not be able to assist you with questions regarding bills from these companies.  You will be contacted with the lab results as soon as they are available. The fastest way to get your results is to activate your My Chart account. Instructions are located on the last page of this paperwork. If you have not heard from [place] regarding the results in 2 weeks, please contact this office.

## 2016-10-06 ENCOUNTER — Other Ambulatory Visit (HOSPITAL_COMMUNITY): Payer: Self-pay | Admitting: Psychiatry

## 2016-10-06 DIAGNOSIS — F3289 Other specified depressive episodes: Secondary | ICD-10-CM

## 2017-07-20 ENCOUNTER — Encounter: Payer: Self-pay | Admitting: Family Medicine

## 2017-07-20 ENCOUNTER — Ambulatory Visit: Payer: BC Managed Care – PPO | Admitting: Family Medicine

## 2017-07-20 ENCOUNTER — Other Ambulatory Visit: Payer: Self-pay

## 2017-07-20 VITALS — BP 112/70 | HR 90 | Temp 97.4°F | Resp 18 | Ht 66.0 in | Wt 180.8 lb

## 2017-07-20 DIAGNOSIS — B86 Scabies: Secondary | ICD-10-CM | POA: Diagnosis not present

## 2017-07-20 MED ORDER — IVERMECTIN 3 MG PO TABS: 200.0000 ug/kg | ORAL_TABLET | ORAL | 1 refills | Status: DC

## 2017-07-20 NOTE — Progress Notes (Signed)
   11/15/20186:02 PM  Alison Kelley March 22, 1961, 56 y.o. female [id number]  Chief Complaint  Patient presents with  . Rash    helped a friend move and now has rash and itchy all over arms, hands, legs     HPI:   Patient is a 56 y.o. female who presents today for with a very itchy rash in her hands, wrists, waist line and groin that started after helping a friend move last night. She has never had similar rash before. She has been using hydrocortisone without much relief. She is worried about beg bugs as her friend has some hoarder tendencies. 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 Nobody else has similar rash.   Depression screen [facility] 2/9 07/20/2017 07/21/2016 01/18/2016  Decreased Interest 0 0 0  Down, Depressed, Hopeless 0 1 0  PHQ - 2 Score 0 1 0    No Known Allergies  Prior to Admission medications   Medication Sig Start Date End Date Taking? Authorizing Provider  buPROPion (WELLBUTRIN XL) 300 MG 24 hr tablet Take 1 tablet (300 mg total) by mouth daily. 07/21/16  Yes Alison Eagles, PA-C  sertraline (ZOLOFT) 100 MG tablet Take 1 tablet (100 mg total) by mouth daily. 07/21/16  Yes Alison Eagles, PA-C    Past Medical History:  Diagnosis Date  . Anemia   . Anxiety   . Arthritis 1978-1980   right knee  . Depression   . High cholesterol   . Seizures ([place]) 2010    febrile x1    Past Surgical History:  Procedure Laterality Date  . CESAREAN SECTION    . DILATION AND CURETTAGE OF UTERUS      Social History   Tobacco Use  . Smoking status: Never Smoker  . Smokeless tobacco: Never Used  Substance Use Topics  . Alcohol use: Yes    Alcohol/week: 0.0 oz    Comment: rarely wine    Family History  Problem Relation Age of Onset  . Cancer Father   . Uterine cancer Mother   . Breast cancer Sister   . Cancer Sister   . Depression Son   . ADD / ADHD Son   . ADD / ADHD Son   . Cancer Maternal Uncle   . Colon cancer Neg Hx     ROS Per hpi  OBJECTIVE:  Blood pressure  112/70, pulse 90, temperature (!) 97.4 F (36.3 C), resp. rate 18, height 5\' 6"  (1.676 m), weight 180 lb 12.8 oz (82 kg), SpO2 100 %.  Physical Exam  Gen: AAOx3, NAD, scratching constantly in clinic Skin: scattered erythematous papules, many excoriated between fingers, in linear fashion along wrists, along waist line.    ASSESSMENT and PLAN 1. Scabies Discussed supportive measures, new meds r/se/b and RTC precautions. Patient educational handout given. - ivermectin (STROMECTOL) 3 MG TABS tablet; Take 5.5 tablets (16,500 mcg total) once a week by mouth x 2.  Return if symptoms worsen or fail to improve.    Rutherford Guys, MD [facility] at [place], [place] [address] Ph.  [phone] Fax [phone]

## 2017-07-20 NOTE — Patient Instructions (Addendum)
IF you received an x-ray today, you will receive an invoice from [facility]. Please contact [facility] at [phone] with questions or concerns regarding your invoice.   IF you received labwork today, you will receive an invoice from [facility]. Please contact [facility] at [phone] with questions or concerns regarding your invoice.   Our billing staff will not be able to assist you with questions regarding bills from these companies.  You will be contacted with the lab results as soon as they are available. The fastest way to get your results is to activate your My Chart account. Instructions are located on the last page of this paperwork. If you have not heard from [place] regarding the results in 2 weeks, please contact this office.     Scabies, Adult Scabies is a skin condition that happens when very small insects get under the skin (infestation). This causes a rash and severe itchiness. Scabies can spread from person to person (is contagious). If you get scabies, it is common for others in your household to get scabies too. With proper treatment, symptoms usually go away in 2-4 weeks. Scabies usually does not cause lasting problems. What are the causes? This condition is caused by mites (Sarcoptes scabiei, or human itch mites) that can only be seen with a microscope. The mites get into the top layer of skin and lay eggs. Scabies can spread from person to person through:  Close contact with a person who has scabies.  Contact with infested items, such as towels, bedding, or clothing.  What increases the risk? This condition is more likely to develop in:  People who live in nursing homes and other extended-care facilities.  People who have sexual contact with a partner who has scabies.  Young children who attend child care facilities.  People who care for others who are at increased risk for scabies.  What are the signs or symptoms? Symptoms of this  condition may include:  Severe itchiness. This is often worse at night.  A rash that includes tiny red bumps or blisters. The rash commonly occurs on the wrist, elbow, armpit, fingers, waist, groin, or buttocks. Bumps may form a line (burrow) in some areas.  Skin irritation. This can include scaly patches or sores.  How is this diagnosed? This condition is diagnosed with a physical exam. Your health care provider will look closely at your skin. In some cases, your health care provider may take a sample of your affected skin (skin scraping) and have it examined under a microscope. How is this treated? This condition may be treated with:  Medicated cream or lotion that kills the mites. This is spread on the entire body and left on for several hours. Usually, one treatment with medicated cream or lotion is enough to kill all of the mites. In severe cases, the treatment may be repeated.  Medicated cream that relieves itching.  Medicines that help to relieve itching.  Medicines that kill the mites. This treatment is rarely used.  Follow these instructions at home:  Medicines  Take or apply over-the-counter and prescription medicines as told by your health care provider.  Apply medicated cream or lotion as told by your health care provider.  Do not wash off the medicated cream or lotion until the necessary amount of time has passed. Skin Care  Avoid scratching your affected skin.  Keep your fingernails closely trimmed to reduce injury from scratching.  Take cool baths or apply cool washcloths to help reduce  itching. General instructions  Clean all items that you recently had contact with, including bedding, clothing, and furniture. Do this on the same day that your treatment starts. ? Use hot water when you wash items. ? Place unwashable items into closed, airtight plastic bags for at least 3 days. The mites cannot live for more than 3 days away from human skin. ? Vacuum furniture  and mattresses that you use.  Make sure that other people who may have been infested are examined by a health care provider. These include members of your household and anyone who may have had contact with infested items.  Keep all follow-up visits as told by your health care provider. This is important. Contact a health care provider if:  You have itching that does not go away after 4 weeks of treatment.  You continue to develop new bumps or burrows.  You have redness, swelling, or pain in your rash area after treatment.  You have fluid, blood, or pus coming from your rash. This information is not intended to replace advice given to you by your health care provider. Make sure you discuss any questions you have with your health care provider. Document Released: 05/13/2015 Document Revised: 01/28/2016 Document Reviewed: 03/24/2015 Elsevier Interactive Patient Education  [employer].

## 2017-07-21 ENCOUNTER — Telehealth: Payer: Self-pay | Admitting: Family Medicine

## 2017-07-21 NOTE — Telephone Encounter (Signed)
Copied from [place] [id number]. Topic: Quick Communication - See Telephone Encounter >> Jul 21, 2017  3:49 PM Patrice Paradise wrote: Patient would like for Dr. Karna Christmas to give her a call [phone]. Patient is been treated for scabies and would like to know if she should be sleeping in the same bed as her husband. Also, would like to know if her husband should receive treatment. Husband is not showing any sCRM for notification. See Telephone encounter for: 07/21/17.

## 2017-07-25 ENCOUNTER — Ambulatory Visit: Payer: BC Managed Care – PPO | Admitting: Psychology

## 2017-07-25 NOTE — Telephone Encounter (Signed)
It is okay to sleep with her husband as she has been treated. He does not need to be treated if he does not have any symptoms. thanks

## 2017-07-26 ENCOUNTER — Ambulatory Visit: Payer: BC Managed Care – PPO | Admitting: Urgent Care

## 2017-07-26 ENCOUNTER — Ambulatory Visit: Payer: BC Managed Care – PPO | Admitting: Emergency Medicine

## 2017-07-26 NOTE — Telephone Encounter (Signed)
Phone call to patient. Relayed message from provider. She will call with further questions or concerns.

## 2017-08-31 ENCOUNTER — Ambulatory Visit: Payer: Self-pay

## 2017-08-31 ENCOUNTER — Telehealth: Payer: Self-pay | Admitting: Family Medicine

## 2017-08-31 NOTE — Telephone Encounter (Signed)
Copied from [place]. Topic: Quick Communication - See Telephone Encounter >> Aug 31, 2017 10:07 AM Cleaster Corin, NT wrote: CRM for notification. See Telephone encounter for:   08/31/17. Pt. Calling says that the same problem has arrived again that she was seen in office for a few weeks ago. Pt. Says that scabies has appeared again she is out of town and was seeing if she can get a rx. Of the med. Permethrin (pt. Alison Kelley it was a pill that she took) if able to do so pt. Would like rx. Sent to [facility] in [facility] [address] [place] - [address] [address] [place] [address] Phone: [phone] Fax: [phone]

## 2017-08-31 NOTE — Telephone Encounter (Signed)
Patient called in with c/o "rash, probably scabies has come back." She reports she is out of town in [place] and is staying in a hotel that has [employer], which she is checking out of that hotel. She says she woke up at 2 am scratching, went to the mirror and saw the red rash over her body, under breast, between her thighs, on her hands and says her scalp itches. She reports being treated for scabies in November by her PCP and wants to know if the medicine can be called in to the [facility] in [place]. The protocol doesn't indicate going to see the doctor and she says she would rather not see anyone in [place].  Home care advice is recommended.  As the home care advice was being read, she asked could her husband get the same treatment, since she has been in close contact with him and they are sleeping in the same bed.  She understands the home care advice.  I advised that the provider will see all the notes entered and make a decision as to the next course of treatment for her, she verbalized understanding.  Reason for Disposition . Scabies treatment, questions about  Answer Assessment - Initial Assessment Questions 1. PLACE OF EXPOSURE: "Where were you when you were exposed?" (e.g., home, work)     Hotel in the middle of the night 2. TYPE OF EXPOSURE: "How were you exposed?" "How much contact was there?"     Unknown of how I was exposed; unknown of how much contact 3. DATE OF EXPOSURE: "When did the exposure occur?" (e.g., days)     2am this morning woke up scratching 4. SYMPTOMS: "Do you have any symptoms?" (e.g., itching, bumpy rash)  If Yes: "What does it look like?" "Which parts of your body are affected?"     Itching, looks like red rash, raised. Under right breast, bilateral inner thighs toward my crotch, back of both hands. 5. PRIOR HISTORY: "Have you ever had scabies before?"    Yes-November, 2018 6. CLOSE CONTACTS: "Does any person who lives with you or has had close contact with  you have itching?"     No  Protocols used: SCABIES EXPOSURE-A-AH

## 2017-08-31 NOTE — Telephone Encounter (Signed)
Noted, as I would have wanted her to be evaluated.

## 2017-08-31 NOTE — Telephone Encounter (Signed)
Please advise 

## 2017-08-31 NOTE — Telephone Encounter (Signed)
Patient was called back to notify no note yet from her PCP, she informed me that she found a provider in [place] who would see her today at 4pm.

## 2017-08-31 NOTE — Telephone Encounter (Signed)
Forwarded to Dr. Pamella Pert as she treated patient for this issue. Thanks.

## 2017-08-31 NOTE — Telephone Encounter (Signed)
Copied from [place]. Topic: Quick Communication - See Telephone Encounter >> Aug 31, 2017 10:07 AM Cleaster Corin, NT wrote: CRM for notification. See Telephone encounter for:   08/31/17. Pt. Calling says that the same problem has arrived again that she was seen in office for a few weeks ago. Pt. Says that scabies has appeared again she is out of town and was seeing if she can get a rx. Of the med. Permethrin (pt. Alison Kelley it was a pill that she took) if able to do so pt. Would like rx. Sent to [facility] in [facility] [address] [place] - [address] [address] [place] [address] Phone: [phone] Fax: [phone]

## 2017-09-01 NOTE — Telephone Encounter (Signed)
Duplicate. Patient saw provider in [place]

## 2017-12-21 ENCOUNTER — Encounter: Payer: Self-pay | Admitting: Family Medicine

## 2017-12-21 ENCOUNTER — Ambulatory Visit (INDEPENDENT_AMBULATORY_CARE_PROVIDER_SITE_OTHER): Payer: BC Managed Care – PPO | Admitting: Family Medicine

## 2017-12-21 VITALS — BP 100/70 | HR 69 | Temp 98.6°F | Ht 67.0 in | Wt 172.2 lb

## 2017-12-21 DIAGNOSIS — Z1329 Encounter for screening for other suspected endocrine disorder: Secondary | ICD-10-CM

## 2017-12-21 DIAGNOSIS — Z130 Encounter for screening for diseases of the blood and blood-forming organs and certain disorders involving the immune mechanism: Secondary | ICD-10-CM | POA: Diagnosis not present

## 2017-12-21 DIAGNOSIS — E785 Hyperlipidemia, unspecified: Secondary | ICD-10-CM | POA: Diagnosis not present

## 2017-12-21 DIAGNOSIS — N951 Menopausal and female climacteric states: Secondary | ICD-10-CM | POA: Diagnosis not present

## 2017-12-21 DIAGNOSIS — Z124 Encounter for screening for malignant neoplasm of cervix: Secondary | ICD-10-CM | POA: Diagnosis not present

## 2017-12-21 DIAGNOSIS — Z131 Encounter for screening for diabetes mellitus: Secondary | ICD-10-CM | POA: Diagnosis not present

## 2017-12-21 DIAGNOSIS — Z0000 Encounter for general adult medical examination without abnormal findings: Secondary | ICD-10-CM

## 2017-12-21 MED ORDER — ESTROGENS, CONJUGATED 0.625 MG/GM VA CREA: TOPICAL_CREAM | VAGINAL | 6 refills | Status: DC

## 2017-12-21 NOTE — Progress Notes (Addendum)
Subjective:  By signing my name below, I, Alison Kelley, attest that this documentation has been prepared under the direction and in the presence of Alison Agreste, MD Electronically Signed: Ladene Artist, ED Scribe 12/21/2017 at 11:50 AM.   Patient ID: Alison Kelley, female    DOB: 1961-02-10, 57 y.o.   MRN: [id number]   Chief Complaint  Patient presents with  . Annual Exam    CPE would like to do pap today if can   . Head cold    Congestion, fatigue, headache, bodyache & flem (started yesterday )   HPI Alison Kelley is a 57 y.o. female who presents to Primary Care at [facility] for an annual exam. New pt to me. No recent visit with me but I'm listed as PCP. Pt is fasting at this visit.  H/o Depression Zoloft and Wellbutrin. Followed by psychiatry.  Hyperlipidemia Lab Results  Component Value Date   CHOL 250 (H) 12/19/2015   HDL 54 12/19/2015   LDLCALC 175 (H) 12/19/2015   LDLDIRECT 172.5 10/30/2009   TRIG 106 12/19/2015   CHOLHDL 4.6 12/19/2015   Lab Results  Component Value Date   ALT 27 01/18/2016   AST 29 01/18/2016   ALKPHOS 83 01/18/2016   BILITOT 0.4 01/18/2016  Pt has taken a statin in the past but stopped the medication when she converted to a mainly vegan diet.  CA Screening Colonoscopy: 04/2016 by Dr. Nichola Sizer polyp removed that was a tubular adenoma Breast CA Screening: h/o fibrocystic breast, mammogram 09/2015 without evidence of malignancy. Scattered areas of fibroglandular density. Sister with a h/o breast CA at 0-1 Stage. Cervical CA Screening: pap smear 09/2014, neg. Mother with a h/o uterine CA.  Family Hx: Father with a h/o Hodgkin's. Pt has not noticed any adenopathy.  Immunizations Immunization History  Administered Date(s) Administered  . Hepatitis A 06/28/2006  . Influenza Whole 07/11/2002  . Influenza-Unspecified 06/19/2016  . Td 06/28/2006  . Tdap 12/16/2015  Shingles:   Depression Screening Depression screen [facility] 2/9  12/21/2017 07/20/2017 07/21/2016 01/18/2016 01/18/2016  Decreased Interest 0 0 0 0 0  Down, Depressed, Hopeless 0 0 1 0 0  PHQ - 2 Score 0 0 1 0 0  See above regarding depression.   Visual Acuity Screening   Right eye Left eye Both eyes  Without correction: 20/25-2 20/30 20/20-1  With correction:      Dentist: seen annually Exercise: pt exercises 3-4 days/wk Wt Readings from Last 3 Encounters:  12/21/17 172 lb 3.2 oz (78.1 kg)  07/20/17 180 lb 12.8 oz (82 kg)  07/21/16 174 lb (78.9 kg)  Body mass index is 26.97 kg/m.  Head Cold Pt reports fatigue, congestion, postnasal drip, rhinorrhea, sinus pressure, sneezing, sore throat, productive cough with phlegm, HAs and body aches onset yesterday. She has tried OTC Cold and Flu meds. Denies fever, sob, wheezing, hemoptysis.  Postmenopausal Dryness Pt reports postmenopausal dryness and dyspareunia which she has used premarin cream in the past for with relief. Reports some hot flashes x 10 yrs. Denies vaginal bleeding.  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 Works part-time at [employer] as a [employer].  Patient Active Problem List   Diagnosis Date Noted  . FATIGUE, CHRONIC 11/12/2008  . HYPERLIPIDEMIA 04/27/2007  . DEPRESSION 04/27/2007  . ADHD 04/27/2007  . FIBROCYSTIC BREAST DISEASE 04/27/2007   Past Medical History:  Diagnosis Date  . Anemia   . Anxiety   . Arthritis 1978-1980   right knee  . Depression   .  High cholesterol   . Seizures ([place]) 2010    febrile x1   Past Surgical History:  Procedure Laterality Date  . CESAREAN SECTION    . DILATION AND CURETTAGE OF UTERUS     No Known Allergies Prior to Admission medications   Medication Sig Start Date End Date Taking? Authorizing Provider  buPROPion (WELLBUTRIN XL) 300 MG 24 hr tablet Take 1 tablet (300 mg total) by mouth daily. 07/21/16   Jaynee Eagles, PA-C  ivermectin (STROMECTOL) 3 MG TABS tablet Take 5.5 tablets (16,500 mcg total) once a week by mouth. 07/20/17    Rutherford Guys, MD  sertraline (ZOLOFT) 100 MG tablet Take 1 tablet (100 mg total) by mouth daily. 07/21/16   Jaynee Eagles, PA-C   Social History   Socioeconomic History  . Marital status: Married    Spouse name: Not on file  . Number of children: Not on file  . Years of education: Not on file  . Highest education level: Not on file  Occupational History  . Occupation: Pharmacist, hospital  Social Needs  . Financial resource strain: Not on file  . Food insecurity:    Worry: Not on file    Inability: Not on file  . Transportation needs:    Medical: Not on file    Non-medical: Not on file  Tobacco Use  . Smoking status: Never Smoker  . Smokeless tobacco: Never Used  Substance and Sexual Activity  . Alcohol use: Yes    Alcohol/week: 0.0 oz    Comment: rarely wine  . Drug use: No  . Sexual activity: Yes    Partners: Male    Birth control/protection: IUD  Lifestyle  . Physical activity:    Days per week: Not on file    Minutes per session: Not on file  . Stress: Not on file  Relationships  . Social connections:    Talks on phone: Not on file    Gets together: Not on file    Attends religious service: Not on file    Active member of club or organization: Not on file    Attends meetings of clubs or organizations: Not on file    Relationship status: Not on file  . Intimate partner violence:    Fear of current or ex partner: Not on file    Emotionally abused: Not on file    Physically abused: Not on file    Forced sexual activity: Not on file  Other Topics Concern  . Not on file  Social History Narrative   Lives with her husband.   Two sons live there on the weekends, and an older child when he is home from school in the summers.   Review of Systems  Constitutional: Positive for fatigue. Negative for fever.  HENT: Positive for congestion, postnasal drip, rhinorrhea, sinus pressure, sneezing and sore throat.   Respiratory: Positive for cough. Negative for shortness of breath and  wheezing.   Genitourinary: Positive for dyspareunia. Negative for vaginal bleeding.  Musculoskeletal: Positive for myalgias.  Neurological: Positive for headaches.  Hematological: Negative for adenopathy.      Objective:   Physical Exam  Constitutional: She is oriented to person, place, and time. She appears well-developed and well-nourished. No distress.  HENT:  Head: Normocephalic and atraumatic.  Right Ear: Hearing, tympanic membrane, external ear and ear canal normal.  Left Ear: Hearing, tympanic membrane, external ear and ear canal normal.  Nose: Nose normal.  Mouth/Throat: Oropharynx is clear and moist. No oropharyngeal exudate.  Eyes: Pupils are equal, round, and reactive to light. Conjunctivae and EOM are normal.  Neck: Normal range of motion. Neck supple. No thyromegaly present.  Cardiovascular: Normal rate, regular rhythm, normal heart sounds and intact distal pulses.  No murmur heard. Pulmonary/Chest: Effort normal and breath sounds normal. No respiratory distress. She has no wheezes. She has no rhonchi.  Abdominal: Soft. Bowel sounds are normal. There is no tenderness.  Genitourinary: Uterus normal.    There is no lesion on the right labia. There is no lesion on the left labia. No erythema or bleeding in the vagina. No vaginal discharge found.  Musculoskeletal: Normal range of motion. She exhibits no edema or tenderness.  Lymphadenopathy:    She has no cervical adenopathy.  Neurological: She is alert and oriented to person, place, and time.  Skin: Skin is warm and dry. No rash noted.  Psychiatric: She has a normal mood and affect. Her behavior is normal. Thought content normal.  Vitals reviewed.  Vitals:   12/21/17 1116  BP: 100/70  Pulse: 69  Temp: 98.6 F (37 C)  TempSrc: Oral  SpO2: 97%  Weight: 172 lb 3.2 oz (78.1 kg)  Height: 5\' 7"  (1.702 m)      Assessment & Plan:   Harleen Fineberg is a 57 y.o. female Annual physical exam  - -anticipatory guidance  as below in AVS, screening labs above. Health maintenance items as above in HPI discussed/recommended as applicable.   Screening for diabetes mellitus  - check CMP  Hyperlipidemia, unspecified hyperlipidemia type - Plan: Comprehensive metabolic panel, Lipid panel  -Significant elevation previously, check labs to determine if statin indicated.  Screening for thyroid disorder - Plan: TSH  Screening for deficiency anemia - Plan: CBC  Vaginal dryness, menopausal - Plan: conjugated estrogens (PREMARIN) vaginal cream  -Postmenopausal symptoms.  Agreed to restart Premarin vaginal cream.  Advised to follow-up to discuss hot flashes further, consider hormonal testing.  Also may be due for IUD removal.  Plans on checking on timing of placement.   Current URI sx's, likely viral. Continued symptomatic care, RTC precautions.   Meds ordered this encounter  Medications  . conjugated estrogens (PREMARIN) vaginal cream    Sig: Apply PV 1-3 times per week as needed.    Dispense:  30 g    Refill:  6   Patient Instructions   If cold symptoms are not improving as expected, or any worsening symptoms - please return to discuss further.   Premarin cream if needed for vaginal dryness. Please follow up to discuss hot flushes further if needed and IUD removal.    We recommend that you schedule a mammogram for breast cancer screening. Typically, you do not need a referral to do this. Please contact a local imaging center to schedule your mammogram.  [facility] - [phone]  *ask for the Radiology Department [facility] ([place]) - [phone] or [phone]  [facility] - [phone] [place] [phone] [facility] - [phone]  *ask for the [facility] - [phone]  *ask for the Radiology Department [facility] - [phone]  *ask for the [place] - [phone]   Keeping You Healthy  Get These Tests  Blood Pressure- Have your blood pressure checked by your healthcare provider at least once a year.  Normal blood pressure is 120/80.  Weight- Have your body mass index (BMI) calculated to screen for  obesity.  BMI is a measure of body fat based on height and weight.  You can calculate your own BMI at [employer]velBags.it  Cholesterol- Have your cholesterol checked every year.  Diabetes- Have your blood sugar checked every year if you have high blood pressure, high cholesterol, a family history of diabetes or if you are overweight.  Pap Test - Have a pap test every 1 to 5 years if you have been sexually active.  If you are older than 65 and recent pap tests have been normal you may not need additional pap tests.  In addition, if you have had a hysterectomy  for benign disease additional pap tests are not necessary.  Mammogram-Yearly mammograms are essential for early detection of breast cancer  Screening for Colon Cancer- Colonoscopy starting at age 4. Screening may begin sooner depending on your family history and other health conditions.  Follow up colonoscopy as directed by your Gastroenterologist.  Screening for Osteoporosis- Screening begins at age 44 with bone density scanning, sooner if you are at higher risk for developing Osteoporosis.  Get these medicines  Calcium with Vitamin D- Your body requires 1200-1500 mg of Calcium a day and [phone] IU of Vitamin D a day.  You can only absorb 500 mg of Calcium at a time therefore Calcium must be taken in 2 or 3 separate doses throughout the day.  Hormones- Hormone therapy has been associated with increased risk for certain cancers and heart disease.  Talk to your healthcare provider about if you need relief from menopausal symptoms.  Aspirin- Ask your healthcare provider about taking Aspirin to prevent Heart Disease and Stroke.  Get these Immuniztions  Flu  shot- Every fall  Pneumonia shot- Once after the age of 6; if you are younger ask your healthcare provider if you need a pneumonia shot.  Tetanus- Every ten years.  Zostavax- Once after the age of 15 to prevent shingles.  Take these steps  Don't smoke- Your healthcare provider can help you quit. For tips on how to quit, ask your healthcare provider or go to www.smokefree.gov or call 1-800 QUIT-NOW.  Be physically active- Exercise 5 days a week for a minimum of 30 minutes.  If you are not already physically active, start slow and gradually work up to 30 minutes of moderate physical activity.  Try walking, dancing, bike riding, swimming, etc.  Eat a healthy diet- Eat a variety of healthy foods such as fruits, vegetables, whole grains, low fat milk, low fat cheeses, yogurt, lean meats, chicken, fish, eggs, dried beans, tofu, etc.  For more information go to www.thenutritionsource.org  Dental visit- Brush and floss teeth twice daily; visit your dentist twice a year.  Eye exam- Visit your Optometrist or Ophthalmologist yearly.  Drink alcohol in moderation- Limit alcohol intake to one drink or less a day.  Never drink and drive.  Depression- Your emotional health is as important as your physical health.  If you're feeling down or losing interest in things you normally enjoy, please talk to your healthcare provider.  Seat Belts- can save your life; always wear one  Smoke/Carbon Monoxide detectors- These detectors need to be installed on the appropriate level of your home.  Replace batteries at least once a year.  Violence- If anyone is threatening or hurting you, please tell your healthcare provider.  Living Will/ Health care power of attorney- Discuss with your healthcare provider and family.   IF you received an x-ray today, you will receive an invoice from [facility].  Please contact [facility] at [phone] with questions or concerns regarding your invoice.   IF  you received labwork today, you will receive an invoice from [place]. Please contact [facility] at [phone] with questions or concerns regarding your invoice.   Our billing staff will not be able to assist you with questions regarding bills from these companies.  You will be contacted with the lab results as soon as they are available. The fastest way to get your results is to activate your My Chart account. Instructions are located on the last page of this paperwork. If you have not heard from [place] regarding the results in 2 weeks, please contact this office.    I personally performed the services described in this documentation, which was scribed in my presence. The recorded information has been reviewed and considered for accuracy and completeness, addended by me as needed, and agree with information above.  Signed,   Merri Ray, MD Primary [facility] at [facility].  12/21/17 12:32 PM

## 2017-12-21 NOTE — Patient Instructions (Addendum)
If cold symptoms are not improving as expected, or any worsening symptoms - please return to discuss further.   Premarin cream if needed for vaginal dryness. Please follow up to discuss hot flushes further if needed and IUD removal.    We recommend that you schedule a mammogram for breast cancer screening. Typically, you do not need a referral to do this. Please contact a local imaging center to schedule your mammogram.  [facility] - [phone]  *ask for the Radiology Department The [facility] ([place]) - [phone] or [phone]  [facility] - [phone] [place] [phone] [facility] - [phone]  *ask for the [facility] - [phone]  *ask for the Radiology Department [facility] - [phone]  *ask for the [place] - [phone]   Keeping You Healthy  Get These Tests  Blood Pressure- Have your blood pressure checked by your healthcare provider at least once a year.  Normal blood pressure is 120/80.  Weight- Have your body mass index (BMI) calculated to screen for obesity.  BMI is a measure of body fat based on height and weight.  You can calculate your own BMI at [employer]it  Cholesterol- Have your cholesterol checked every year.  Diabetes- Have your blood sugar checked every year if you have high blood pressure, high cholesterol, a family history of diabetes or if you are overweight.  Pap Test - Have a pap test every 1 to 5 years if you have been sexually active.  If you are older than 65 and recent pap tests have been normal you may not need additional pap tests.  In addition, if you have had a hysterectomy  for benign disease additional pap tests are not necessary.  Mammogram-Yearly mammograms are essential for early detection of breast cancer  Screening for Colon Cancer- Colonoscopy starting  at age 74. Screening may begin sooner depending on your family history and other health conditions.  Follow up colonoscopy as directed by your Gastroenterologist.  Screening for Osteoporosis- Screening begins at age 23 with bone density scanning, sooner if you are at higher risk for developing Osteoporosis.  Get these medicines  Calcium with Vitamin D- Your body requires 1200-1500 mg of Calcium a day and [phone] IU of Vitamin D a day.  You can only absorb 500 mg of Calcium at a time therefore Calcium must be taken in 2 or 3 separate doses throughout the day.  Hormones- Hormone therapy has been associated with increased risk for certain cancers and heart disease.  Talk to your healthcare provider about if you need relief from menopausal symptoms.  Aspirin- Ask your healthcare provider about taking Aspirin to prevent Heart Disease and Stroke.  Get these Immuniztions  Flu shot- Every fall  Pneumonia shot- Once after the age of 21; if you are younger ask your healthcare provider if you need a pneumonia shot.  Tetanus- Every ten years.  Zostavax- Once after the age of 16 to prevent shingles.  Take these steps  Don't smoke- Your healthcare provider can help you quit. For tips on how to quit, ask your healthcare provider or go to www.smokefree.gov or call 1-800 QUIT-NOW.  Be physically active- Exercise 5 days a week for a minimum of 30 minutes.  If you are not already physically active, start slow and gradually work up to 30 minutes of moderate physical activity.  Try walking, dancing, bike riding, swimming, etc.  Eat a healthy diet- Eat a variety of healthy foods such as fruits, vegetables, whole grains, low fat milk, low fat cheeses, yogurt, lean meats, chicken, fish, eggs, dried beans, tofu, etc.  For more information go to www.thenutritionsource.org  Dental visit- Brush and floss teeth twice daily; visit your dentist twice a year.  Eye exam- Visit your Optometrist or Ophthalmologist  yearly.  Drink alcohol in moderation- Limit alcohol intake to one drink or less a day.  Never drink and drive.  Depression- Your emotional health is as important as your physical health.  If you're feeling down or losing interest in things you normally enjoy, please talk to your healthcare provider.  Seat Belts- can save your life; always wear one  Smoke/Carbon Monoxide detectors- These detectors need to be installed on the appropriate level of your home.  Replace batteries at least once a year.  Violence- If anyone is threatening or hurting you, please tell your healthcare provider.  Living Will/ Health care power of attorney- Discuss with your healthcare provider and family.   IF you received an x-ray today, you will receive an invoice from [facility] Radiology. Please contact [facility] Radiology at [phone] with questions or concerns regarding your invoice.   IF you received labwork today, you will receive an invoice from [place]. Please contact [facility] at [phone] with questions or concerns regarding your invoice.   Our billing staff will not be able to assist you with questions regarding bills from these companies.  You will be contacted with the lab results as soon as they are available. The fastest way to get your results is to activate your My Chart account. Instructions are located on the last page of this paperwork. If you have not heard from [place] regarding the results in 2 weeks, please contact this office.

## 2017-12-22 LAB — TSH: TSH: 1.46 u[IU]/mL (ref 0.450–4.500)

## 2017-12-22 LAB — COMPREHENSIVE METABOLIC PANEL
A/G RATIO: 1.5 (ref 1.2–2.2)
ALBUMIN: 4.1 g/dL (ref 3.5–5.5)
ALT: 29 IU/L (ref 0–32)
AST: 30 IU/L (ref 0–40)
Alkaline Phosphatase: 89 IU/L (ref 39–117)
BILIRUBIN TOTAL: 0.3 mg/dL (ref 0.0–1.2)
BUN / CREAT RATIO: 12 (ref 9–23)
BUN: 9 mg/dL (ref 6–24)
CALCIUM: 9.2 mg/dL (ref 8.7–10.2)
CO2: 22 mmol/L (ref 20–29)
Chloride: 103 mmol/L (ref 96–106)
Creatinine, Ser: 0.78 mg/dL (ref 0.57–1.00)
GFR, EST AFRICAN AMERICAN: 99 mL/min/{1.73_m2} (ref >59)
GFR, EST NON AFRICAN AMERICAN: 86 mL/min/{1.73_m2} (ref >59)
Globulin, Total: 2.7 g/dL (ref 1.5–4.5)
Glucose: 89 mg/dL (ref 65–99)
POTASSIUM: 4.2 mmol/L (ref 3.5–5.2)
Sodium: 139 mmol/L (ref 134–144)
TOTAL PROTEIN: 6.8 g/dL (ref 6.0–8.5)

## 2017-12-22 LAB — LIPID PANEL
CHOL/HDL RATIO: 4.6 ratio — AB (ref 0.0–4.4)
Cholesterol, Total: 236 mg/dL — ABNORMAL HIGH (ref 100–199)
HDL: 51 mg/dL (ref >39)
LDL Calculated: 168 mg/dL — ABNORMAL HIGH (ref 0–99)
Triglycerides: 83 mg/dL (ref 0–149)
VLDL CHOLESTEROL CAL: 17 mg/dL (ref 5–40)

## 2017-12-22 LAB — CBC
HEMOGLOBIN: 13.7 g/dL (ref 11.1–15.9)
Hematocrit: 41.4 % (ref 34.0–46.6)
MCH: 31.2 pg (ref 26.6–33.0)
MCHC: 33.1 g/dL (ref 31.5–35.7)
MCV: 94 fL (ref 79–97)
Platelets: 216 10*3/uL (ref 150–379)
RBC: 4.39 x10E6/uL (ref 3.77–5.28)
RDW: 13.7 % (ref 12.3–15.4)
WBC: 6.4 10*3/uL (ref 3.4–10.8)

## 2017-12-23 LAB — PAP IG W/ RFLX HPV ASCU: PAP Smear Comment: 0

## 2018-03-21 ENCOUNTER — Ambulatory Visit: Payer: BC Managed Care – PPO | Admitting: Physician Assistant

## 2018-03-21 VITALS — BP 118/70 | HR 69 | Temp 98.2°F | Resp 17 | Ht 67.0 in | Wt 170.0 lb

## 2018-03-21 DIAGNOSIS — R05 Cough: Secondary | ICD-10-CM

## 2018-03-21 DIAGNOSIS — R059 Cough, unspecified: Secondary | ICD-10-CM

## 2018-03-21 NOTE — Progress Notes (Signed)
   Alison Kelley  MRN: [id number] DOB: 03-25-1961  PCP: [name], MD  Subjective:  Pt is a 57 year old female who presents to clinic for sinus pressure x 10 days.  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  Symptoms started about 10 days ago with a scratchy throat and hoarse voice.  Symptoms evolved into cough, fatigue, body aches.  She went to the time he is Dr. and was given some sort of anti-inflammatory and fever reducer which did not help.  About 4 days ago she procured some amoxicillin from a friend and started taking this.  She is taking 500 mg 3 times a day.  She feels like she is improving.  Review of Systems  Constitutional: Negative for chills, diaphoresis, fatigue and fever.  HENT: Positive for sinus pressure, sinus pain and voice change. Negative for congestion, postnasal drip, rhinorrhea, sneezing, sore throat and trouble swallowing.   Respiratory: Positive for cough. Negative for shortness of breath and wheezing.   Psychiatric/Behavioral: Negative for sleep disturbance.    Patient Active Problem List   Diagnosis Date Noted  . FATIGUE, CHRONIC 11/12/2008  . HYPERLIPIDEMIA 04/27/2007  . DEPRESSION 04/27/2007  . ADHD 04/27/2007  . FIBROCYSTIC BREAST DISEASE 04/27/2007    Current Outpatient Medications on File Prior to Visit  Medication Sig Dispense Refill  . buPROPion (WELLBUTRIN XL) 300 MG 24 hr tablet Take 1 tablet (300 mg total) by mouth daily. 90 tablet 3  . conjugated estrogens (PREMARIN) vaginal cream Apply PV 1-3 times per week as needed. 30 g 6  . sertraline (ZOLOFT) 100 MG tablet Take 1 tablet (100 mg total) by mouth daily. 90 tablet 3   No current facility-administered medications on file prior to visit.     No Known Allergies   Objective:  BP 118/70   Pulse 69   Temp 98.2 F (36.8 C) (Oral)   Resp 17   Ht 5\' 7"  (1.702 m)   Wt 170 lb (77.1 kg)   SpO2 98%   BMI 26.63 kg/m   Physical Exam  Constitutional: She is oriented  to person, place, and time. No distress.  HENT:  Right Ear: Tympanic membrane normal.  Left Ear: Tympanic membrane normal.  Nose: Mucosal edema present. No rhinorrhea. Right sinus exhibits no maxillary sinus tenderness and no frontal sinus tenderness. Left sinus exhibits no maxillary sinus tenderness and no frontal sinus tenderness.  Mouth/Throat: Oropharynx is clear and moist and mucous membranes are normal.  Cardiovascular: Normal rate, regular rhythm and normal heart sounds.  Pulmonary/Chest: Effort normal and breath sounds normal. No respiratory distress. She has no wheezes. She has no rales.  Neurological: She is alert and oriented to person, place, and time.  Skin: Skin is warm and dry.  Psychiatric: Judgment normal.  Vitals reviewed.   Assessment and Plan :  1. Cough -Patient presents with resolving symptoms of cough, body aches which have been giving her problems for the last 10 days.  She initiated amoxicillin 500 mg 3 times a day about 4 days ago.  Advised her to finish the course of medication for a total of 7 days.  Encouraged hydration and Septisol for sore throat.  Return to clinic if no improvement  Mercer Pod, PA-C  [facility] at [place] 03/21/2018 3:31 PM

## 2018-03-21 NOTE — Patient Instructions (Signed)
     IF you received an x-ray today, you will receive an invoice from [facility]. Please contact [facility] at [phone] with questions or concerns regarding your invoice.   IF you received labwork today, you will receive an invoice from [facility]. Please contact [facility] at [phone] with questions or concerns regarding your invoice.   Our billing staff will not be able to assist you with questions regarding bills from these companies.  You will be contacted with the lab results as soon as they are available. The fastest way to get your results is to activate your My Chart account. Instructions are located on the last page of this paperwork. If you have not heard from us regarding the results in 2 weeks, please contact this office.     

## 2018-05-28 ENCOUNTER — Encounter: Payer: Self-pay | Admitting: Family Medicine

## 2018-05-28 ENCOUNTER — Ambulatory Visit: Payer: BC Managed Care – PPO | Admitting: Family Medicine

## 2018-05-28 ENCOUNTER — Other Ambulatory Visit: Payer: Self-pay

## 2018-05-28 VITALS — BP 107/72 | HR 76 | Temp 97.5°F | Resp 17 | Ht 67.0 in | Wt 164.6 lb

## 2018-05-28 DIAGNOSIS — R590 Localized enlarged lymph nodes: Secondary | ICD-10-CM | POA: Diagnosis not present

## 2018-05-28 DIAGNOSIS — Z23 Encounter for immunization: Secondary | ICD-10-CM | POA: Diagnosis not present

## 2018-05-28 DIAGNOSIS — M542 Cervicalgia: Secondary | ICD-10-CM | POA: Diagnosis not present

## 2018-05-28 NOTE — Patient Instructions (Addendum)
Ibuprofen 600 mg (3 x 200 mg) 3 times daily for the next few days to try and calm down inflammation in the nodes.  If not doing better when you have your physical in a few days make sure they check a CBC.  Return as needed or if the problem continues to persist.    If you have lab work done today you will be contacted with your lab results within the next 2 weeks.  If you have not heard from [place] then please contact us. The fastest way to get your results is to register for My Chart.   IF you received an x-ray today, you will receive an invoice from [facility]. Please contact [facility] at [phone] with questions or concerns regarding your invoice.   IF you received labwork today, you will receive an invoice from [facility]. Please contact [facility] at [phone] with questions or concerns regarding your invoice.   Our billing staff will not be able to assist you with questions regarding bills from these companies.  You will be contacted with the lab results as soon as they are available. The fastest way to get your results is to activate your My Chart account. Instructions are located on the last page of this paperwork. If you have not heard from [place] regarding the results in 2 weeks, please contact this office.    We recommend that you schedule a mammogram for breast cancer screening. Typically, you do not need a referral to do this. Please contact a local imaging center to schedule your mammogram.  [facility] - [phone]  *ask for the Radiology Department [facility] ([place]) - [phone] or [phone]  [facility] - [phone] [place] [phone] [facility] - [phone]  *ask for the [facility] - [phone]  *ask for the Radiology Department [facility] - [phone]  *ask for the [place] -  [phone]

## 2018-05-28 NOTE — Progress Notes (Signed)
Patient ID: Alison Kelley, female    DOB: 04-06-1961  Age: 57 y.o. MRN: [id number]  Chief Complaint  Patient presents with  . left side of neck pain, ? lymph node pain    per pt cold sxs x 2 weeks ago and the pain in left side of neck lingering, family hx of lymph node cancer  in father and pt just wants to be checked out, only having discomfort on the left side.    Subjective:   Patient had a recent URI for the last couple weeks.  Last few days she has been noticing a little tender node in the left submandibular area.  She keeps rubbing at it a lot.  She has a family history of lymphoma and it is caused her to worry.  She is due to come in a few days for a physical examination.   Current allergies, medications, problem list, past/family and social histories reviewed.  Objective:  BP 107/72 (BP Location: Right Arm, Patient Position: Sitting, Cuff Size: Normal)   Pulse 76   Temp (!) 97.5 F (36.4 C) (Oral)   Resp 17   Ht 5\' 7"  (1.702 m)   Wt 164 lb 9.6 oz (74.7 kg)   SpO2 96%   BMI 25.78 kg/m   No major distress.  TMs normal.  Throat clear.  Neck supple.  Possibly a tiny node just at the left angle of the jaw.  Just above the submandibular gland.  No other nodes palpated.  She is tender in that area.  She keeps rubbing it a lot.  Assessment & Plan:   Assessment: 1. Cervical pain (neck)   2. Need for prophylactic vaccination and inoculation against influenza   3. Lymphadenopathy, cervical       Plan: See instructions.  Keep your physical exam appointment.  Orders Placed This Encounter  Procedures  . Flu Vaccine QUAD 36+ mos IM    No orders of the defined types were placed in this encounter.        Patient Instructions   Ibuprofen 600 mg (3 x 200 mg) 3 times daily for the next few days to try and calm down inflammation in the nodes.  If not doing better when you have your physical in a few days make sure they check a CBC.  Return as needed or if the  problem continues to persist.    If you have lab work done today you will be contacted with your lab results within the next 2 weeks.  If you have not heard from [place] then please contact us. The fastest way to get your results is to register for My Chart.   IF you received an x-ray today, you will receive an invoice from [facility]. Please contact [facility] at [phone] with questions or concerns regarding your invoice.   IF you received labwork today, you will receive an invoice from [facility]. Please contact [facility] at [phone] with questions or concerns regarding your invoice.   Our billing staff will not be able to assist you with questions regarding bills from these companies.  You will be contacted with the lab results as soon as they are available. The fastest way to get your results is to activate your My Chart account. Instructions are located on the last page of this paperwork. If you have not heard from [place] regarding the results in 2 weeks, please contact this office.    We recommend that you schedule a mammogram for breast cancer screening. Typically,  you do not need a referral to do this. Please contact a local imaging center to schedule your mammogram.  [facility] - [phone]  *ask for the Radiology Department [facility] ([place]) - [phone] or [phone]  [facility] - [phone] [place] [phone] [facility] - [phone]  *ask for the [facility] - [phone]  *ask for the Radiology Department [facility] - [phone]  *ask for the [place] - [phone]    Return if symptoms worsen or fail to improve.   Ruben Reason, MD 05/28/2018

## 2018-05-29 ENCOUNTER — Encounter: Payer: Self-pay | Admitting: Family Medicine

## 2018-05-30 ENCOUNTER — Encounter: Payer: BC Managed Care – PPO | Admitting: Family Medicine

## 2018-06-13 ENCOUNTER — Other Ambulatory Visit: Payer: Self-pay | Admitting: Family Medicine

## 2018-06-13 DIAGNOSIS — Z1231 Encounter for screening mammogram for malignant neoplasm of breast: Secondary | ICD-10-CM

## 2018-06-15 ENCOUNTER — Other Ambulatory Visit: Payer: Self-pay

## 2018-06-15 ENCOUNTER — Ambulatory Visit: Payer: BC Managed Care – PPO | Admitting: Emergency Medicine

## 2018-06-15 ENCOUNTER — Encounter: Payer: Self-pay | Admitting: Emergency Medicine

## 2018-06-15 VITALS — BP 113/77 | HR 61 | Temp 97.8°F | Resp 16 | Ht 67.0 in | Wt 165.6 lb

## 2018-06-15 DIAGNOSIS — R590 Localized enlarged lymph nodes: Secondary | ICD-10-CM

## 2018-06-15 NOTE — Patient Instructions (Addendum)
If you have lab work done today you will be contacted with your lab results within the next 2 weeks.  If you have not heard from [place] then please contact us. The fastest way to get your results is to register for My Chart.   IF you received an x-ray today, you will receive an invoice from [facility]. Please contact [facility] at [phone] with questions or concerns regarding your invoice.   IF you received labwork today, you will receive an invoice from [facility]. Please contact [facility] at [phone] with questions or concerns regarding your invoice.   Our billing staff will not be able to assist you with questions regarding bills from these companies.  You will be contacted with the lab results as soon as they are available. The fastest way to get your results is to activate your My Chart account. Instructions are located on the last page of this paperwork. If you have not heard from [place] regarding the results in 2 weeks, please contact this office.     Lymphadenopathy Lymphadenopathy refers to swollen or enlarged lymph glands, also called lymph nodes. Lymph glands are part of your body's defense (immune) system, which protects the body from infections, germs, and diseases. Lymph glands are found in many locations in your body, including the neck, underarm, and groin. Many things can cause lymph glands to become enlarged. When your immune system responds to germs, such as viruses or bacteria, infection-fighting cells and fluid build up. This causes the glands to grow in size. Usually, this is not something to worry about. The swelling and any soreness often go away without treatment. However, swollen lymph glands can also be caused by a number of diseases. Your health care provider may do various tests to help determine the cause. If the cause of your swollen lymph glands cannot be found, it is important to monitor your condition to make sure the swelling goes  away. Follow these instructions at home: Watch your condition for any changes. The following actions may help to lessen any discomfort you are feeling:  Get plenty of rest.  Take medicines only as directed by your health care provider. Your health care provider may recommend over-the-counter medicines for pain.  Apply moist heat compresses to the site of swollen lymph nodes as directed by your health care provider. This can help reduce any pain.  Check your lymph nodes daily for any changes.  Keep all follow-up visits as directed by your health care provider. This is important.  Contact a health care provider if:  Your lymph nodes are still swollen after 2 weeks.  Your swelling increases or spreads to other areas.  Your lymph nodes are hard, seem fixed to the skin, or are growing rapidly.  Your skin over the lymph nodes is red and inflamed.  You have a fever.  You have chills.  You have fatigue.  You develop a sore throat.  You have abdominal pain.  You have weight loss.  You have night sweats. Get help right away if:  You notice fluid leaking from the area of the enlarged lymph node.  You have severe pain in any area of your body.  You have chest pain.  You have shortness of breath. This information is not intended to replace advice given to you by your health care provider. Make sure you discuss any questions you have with your health care provider. Document Released: 05/31/2008 Document Revised: 01/28/2016 Document Reviewed: 03/27/2014 Elsevier Interactive Patient  Education  2018 Elsevier Inc.  

## 2018-06-15 NOTE — Progress Notes (Signed)
Alison Kelley 57 y.o.   Chief Complaint  Patient presents with  . Neck Pain    pt states she has a lump on the left side of neck that is painful     HISTORY OF PRESENT ILLNESS: This is a 57 y.o. female complaining of persistent swelling of left cervical lymph node..  Seen here for the same on 05/28/2018.  No changes.  Mild pain/discomfort.  No fever or chills.  No weight loss.  No other significant symptoms.  HPI   Prior to Admission medications   Medication Sig Start Date End Date Taking? Authorizing Provider  buPROPion (WELLBUTRIN XL) 300 MG 24 hr tablet Take 1 tablet (300 mg total) by mouth daily. 07/21/16  Yes Jaynee Eagles, PA-C  conjugated estrogens (PREMARIN) vaginal cream Apply PV 1-3 times per week as needed. 12/21/17  Yes [name], MD  sertraline (ZOLOFT) 100 MG tablet Take 1 tablet (100 mg total) by mouth daily. 07/21/16  Yes Jaynee Eagles, PA-C    No Known Allergies  Patient Active Problem List   Diagnosis Date Noted  . FATIGUE, CHRONIC 11/12/2008  . HYPERLIPIDEMIA 04/27/2007  . DEPRESSION 04/27/2007  . ADHD 04/27/2007  . FIBROCYSTIC BREAST DISEASE 04/27/2007    Past Medical History:  Diagnosis Date  . Anemia   . Anxiety   . Arthritis 1978-1980   right knee  . Depression   . High cholesterol   . Seizures ([place]) 2010    febrile x1    Past Surgical History:  Procedure Laterality Date  . CESAREAN SECTION    . DILATION AND CURETTAGE OF UTERUS      Social History   Socioeconomic History  . Marital status: Married    Spouse name: Not on file  . Number of children: Not on file  . Years of education: Not on file  . Highest education level: Not on file  Occupational History  . Occupation: Pharmacist, hospital  Social Needs  . Financial resource strain: Not on file  . Food insecurity:    Worry: Not on file    Inability: Not on file  . Transportation needs:    Medical: Not on file    Non-medical: Not on file  Tobacco Use  . Smoking status: Never Smoker   . Smokeless tobacco: Never Used  Substance and Sexual Activity  . Alcohol use: Yes    Alcohol/week: 0.0 standard drinks    Comment: rarely wine  . Drug use: No  . Sexual activity: Yes    Partners: Male    Birth control/protection: IUD  Lifestyle  . Physical activity:    Days per week: Not on file    Minutes per session: Not on file  . Stress: Not on file  Relationships  . Social connections:    Talks on phone: Not on file    Gets together: Not on file    Attends religious service: Not on file    Active member of club or organization: Not on file    Attends meetings of clubs or organizations: Not on file    Relationship status: Not on file  . Intimate partner violence:    Fear of current or ex partner: Not on file    Emotionally abused: Not on file    Physically abused: Not on file    Forced sexual activity: Not on file  Other Topics Concern  . Not on file  Social History Narrative   Lives with her husband.   Two sons live there on  the weekends, and an older child when he is home from school in the summers.    Family History  Problem Relation Age of Onset  . Cancer Father   . Uterine cancer Mother   . Breast cancer Sister   . Cancer Sister   . Depression Son   . ADD / ADHD Son   . ADD / ADHD Son   . Cancer Maternal Uncle   . Colon cancer Neg Hx      Review of Systems  Constitutional: Negative.  Negative for chills and fever.  HENT: Negative.  Negative for ear pain and sore throat.   Eyes: Negative.  Negative for discharge and redness.  Respiratory: Negative.  Negative for cough and shortness of breath.   Cardiovascular: Negative.  Negative for chest pain and palpitations.  Gastrointestinal: Negative.  Negative for nausea and vomiting.  Skin: Negative.  Negative for rash.  Neurological: Negative.  Negative for dizziness and headaches.  Endo/Heme/Allergies: Negative.     Vitals:   06/15/18 1641  BP: 113/77  Pulse: 61  Resp: 16  Temp: 97.8 F (36.6 C)   SpO2: 99%    Physical Exam  Constitutional: She is oriented to person, place, and time. She appears well-developed and well-nourished.  HENT:  Head: Normocephalic and atraumatic.  Right Ear: External ear and ear canal normal.  Left Ear: Tympanic membrane, external ear and ear canal normal.  Nose: Nose normal.  Mouth/Throat: Oropharynx is clear and moist.  Eyes: Pupils are equal, round, and reactive to light. Conjunctivae and EOM are normal.  Neck: Normal range of motion. Neck supple.  Cardiovascular: Normal rate, regular rhythm and normal heart sounds.  Pulmonary/Chest: Effort normal and breath sounds normal.  Musculoskeletal: Normal range of motion.  Lymphadenopathy:    She has cervical adenopathy (Mildly tender small left cervical adenopathy).  Neurological: She is alert and oriented to person, place, and time.  Skin: Skin is warm and dry. Capillary refill takes less than 2 seconds.  Psychiatric: She has a normal mood and affect. Her behavior is normal.     ASSESSMENT & PLAN: [name] was seen today for neck pain.  Diagnoses and all orders for this visit:  Lymphadenopathy, cervical -     CBC with Differential/Platelet -     Comprehensive metabolic panel   Patient Instructions       If you have lab work done today you will be contacted with your lab results within the next 2 weeks.  If you have not heard from [place] then please contact us. The fastest way to get your results is to register for My Chart.   IF you received an x-ray today, you will receive an invoice from [facility]. Please contact [facility] at [phone] with questions or concerns regarding your invoice.   IF you received labwork today, you will receive an invoice from [facility]. Please contact [facility] at [phone] with questions or concerns regarding your invoice.   Our billing staff will not be able to assist you with questions regarding bills from these companies.  You will  be contacted with the lab results as soon as they are available. The fastest way to get your results is to activate your My Chart account. Instructions are located on the last page of this paperwork. If you have not heard from [place] regarding the results in 2 weeks, please contact this office.     Lymphadenopathy Lymphadenopathy refers to swollen or enlarged lymph glands, also called lymph nodes. Lymph glands  are part of your body's defense (immune) system, which protects the body from infections, germs, and diseases. Lymph glands are found in many locations in your body, including the neck, underarm, and groin. Many things can cause lymph glands to become enlarged. When your immune system responds to germs, such as viruses or bacteria, infection-fighting cells and fluid build up. This causes the glands to grow in size. Usually, this is not something to worry about. The swelling and any soreness often go away without treatment. However, swollen lymph glands can also be caused by a number of diseases. Your health care provider may do various tests to help determine the cause. If the cause of your swollen lymph glands cannot be found, it is important to monitor your condition to make sure the swelling goes away. Follow these instructions at home: Watch your condition for any changes. The following actions may help to lessen any discomfort you are feeling:  Get plenty of rest.  Take medicines only as directed by your health care provider. Your health care provider may recommend over-the-counter medicines for pain.  Apply moist heat compresses to the site of swollen lymph nodes as directed by your health care provider. This can help reduce any pain.  Check your lymph nodes daily for any changes.  Keep all follow-up visits as directed by your health care provider. This is important.  Contact a health care provider if:  Your lymph nodes are still swollen after 2 weeks.  Your swelling increases or  spreads to other areas.  Your lymph nodes are hard, seem fixed to the skin, or are growing rapidly.  Your skin over the lymph nodes is red and inflamed.  You have a fever.  You have chills.  You have fatigue.  You develop a sore throat.  You have abdominal pain.  You have weight loss.  You have night sweats. Get help right away if:  You notice fluid leaking from the area of the enlarged lymph node.  You have severe pain in any area of your body.  You have chest pain.  You have shortness of breath. This information is not intended to replace advice given to you by your health care provider. Make sure you discuss any questions you have with your health care provider. Document Released: 05/31/2008 Document Revised: 01/28/2016 Document Reviewed: 03/27/2014 Elsevier Interactive Patient Education  2018 Elsevier Inc.      Agustina Caroli, MD [facility]

## 2018-06-16 LAB — COMPREHENSIVE METABOLIC PANEL
ALT: 21 IU/L (ref 0–32)
AST: 24 IU/L (ref 0–40)
Albumin/Globulin Ratio: 1.8 (ref 1.2–2.2)
Albumin: 4.6 g/dL (ref 3.5–5.5)
Alkaline Phosphatase: 93 IU/L (ref 39–117)
BUN/Creatinine Ratio: 12 (ref 9–23)
BUN: 10 mg/dL (ref 6–24)
Bilirubin Total: 0.2 mg/dL (ref 0.0–1.2)
CALCIUM: 9.2 mg/dL (ref 8.7–10.2)
CO2: 24 mmol/L (ref 20–29)
CREATININE: 0.84 mg/dL (ref 0.57–1.00)
Chloride: 99 mmol/L (ref 96–106)
GFR calc Af Amer: 90 mL/min/{1.73_m2} (ref >59)
GFR, EST NON AFRICAN AMERICAN: 78 mL/min/{1.73_m2} (ref >59)
GLOBULIN, TOTAL: 2.5 g/dL (ref 1.5–4.5)
GLUCOSE: 93 mg/dL (ref 65–99)
Potassium: 4.1 mmol/L (ref 3.5–5.2)
SODIUM: 138 mmol/L (ref 134–144)
Total Protein: 7.1 g/dL (ref 6.0–8.5)

## 2018-06-16 LAB — CBC WITH DIFFERENTIAL/PLATELET
BASOS ABS: 0.1 10*3/uL (ref 0.0–0.2)
Basos: 1 %
EOS (ABSOLUTE): 0.2 10*3/uL (ref 0.0–0.4)
EOS: 3 %
HEMATOCRIT: 39.3 % (ref 34.0–46.6)
Hemoglobin: 13.6 g/dL (ref 11.1–15.9)
Immature Grans (Abs): 0 10*3/uL (ref 0.0–0.1)
Immature Granulocytes: 0 %
LYMPHS ABS: 2.3 10*3/uL (ref 0.7–3.1)
Lymphs: 38 %
MCH: 31.1 pg (ref 26.6–33.0)
MCHC: 34.6 g/dL (ref 31.5–35.7)
MCV: 90 fL (ref 79–97)
MONOS ABS: 0.3 10*3/uL (ref 0.1–0.9)
Monocytes: 6 %
NEUTROS PCT: 52 %
Neutrophils Absolute: 3.2 10*3/uL (ref 1.4–7.0)
PLATELETS: 258 10*3/uL (ref 150–450)
RBC: 4.37 x10E6/uL (ref 3.77–5.28)
RDW: 12.9 % (ref 12.3–15.4)
WBC: 6.1 10*3/uL (ref 3.4–10.8)

## 2018-06-18 ENCOUNTER — Encounter: Payer: Self-pay | Admitting: *Deleted

## 2018-06-23 ENCOUNTER — Encounter: Payer: Self-pay | Admitting: Family Medicine

## 2018-06-23 ENCOUNTER — Ambulatory Visit (HOSPITAL_BASED_OUTPATIENT_CLINIC_OR_DEPARTMENT_OTHER)
Admission: RE | Admit: 2018-06-23 | Discharge: 2018-06-23 | Disposition: A | Discharge status: Home / Self Care | Payer: BC Managed Care – PPO | Source: Ambulatory Visit | Attending: Family Medicine | Admitting: Family Medicine

## 2018-06-23 DIAGNOSIS — Z1231 Encounter for screening mammogram for malignant neoplasm of breast: Secondary | ICD-10-CM | POA: Insufficient documentation

## 2018-10-29 ENCOUNTER — Other Ambulatory Visit: Payer: Self-pay

## 2018-10-29 ENCOUNTER — Ambulatory Visit (INDEPENDENT_AMBULATORY_CARE_PROVIDER_SITE_OTHER): Payer: BC Managed Care – PPO | Admitting: Family Medicine

## 2018-10-29 VITALS — BP 115/71 | HR 69 | Temp 98.2°F | Ht 67.0 in | Wt 169.8 lb

## 2018-10-29 DIAGNOSIS — M2550 Pain in unspecified joint: Secondary | ICD-10-CM | POA: Diagnosis not present

## 2018-10-29 NOTE — Patient Instructions (Signed)
° ° ° °  If you have lab work done today you will be contacted with your lab results within the next 2 weeks.  If you have not heard from us then please contact us. The fastest way to get your results is to register for My Chart. ° ° °IF you received an x-ray today, you will receive an invoice from [facility]. Please contact [facility] at [phone] with questions or concerns regarding your invoice.  ° °IF you received labwork today, you will receive an invoice from [facility]. Please contact [facility] at [phone] with questions or concerns regarding your invoice.  ° °Our billing staff will not be able to assist you with questions regarding bills from these companies. ° °You will be contacted with the lab results as soon as they are available. The fastest way to get your results is to activate your My Chart account. Instructions are located on the last page of this paperwork. If you have not heard from us regarding the results in 2 weeks, please contact this office. °  ° ° ° °

## 2018-10-29 NOTE — Progress Notes (Signed)
Subjective:    Patient ID: Alison Kelley, female    DOB: 1961-03-18, 58 y.o.   MRN: [id number]  HPI Alison Kelley is a 58 y.o. female Presents today for: Chief Complaint  Patient presents with  . Hip Pain    pain in joints, right hip, shoulder and knees. Would like to make sure do not have rheumatoid arthritis. Would like to get a referral for bone density   Multiple arthralgias as above. Had arthritis in knee as a child. "loose kneecaps". Ultimately had PT - no rheum tests.  Current symptoms - bilateral knee and bilateral knee pain, R greater than left hip pain. Pain is throbbing at rest.  Sciatica on left in past.  Exercise makes pain better.  No new activities, no injury.  No fever No rash.  No joint swelling.  Improves with exercise.  Tx: Tried glucosamine for a few days with some relief.     Review of Systems Per HPI    Objective:   Physical Exam Constitutional:      Appearance: She is well-developed.  HENT:     Head: Normocephalic and atraumatic.  Eyes:     Conjunctiva/sclera: Conjunctivae normal.     Pupils: Pupils are equal, round, and reactive to light.  Neck:     Musculoskeletal: Normal range of motion and neck supple.     Thyroid: No thyromegaly.  Cardiovascular:     Rate and Rhythm: Normal rate and regular rhythm.  Pulmonary:     Effort: Pulmonary effort is normal.  Abdominal:     Tenderness: There is abdominal tenderness.  Musculoskeletal: Normal range of motion.        General: No tenderness.     Right shoulder: Normal.     Left shoulder: Normal.     Right elbow: Normal.    Left elbow: Normal.     Right wrist: Normal.     Left wrist: Normal.     Right hip: Normal.     Left hip: Normal.     Right knee: Normal.     Left knee: Normal.     Comments: No focal joint swelling, specifically of hand joints.  No rash.  No erythema.  Lymphadenopathy:     Cervical: No cervical adenopathy.  Skin:    General: Skin is warm and dry.   Findings: No rash.  Neurological:     Mental Status: She is alert and oriented to person, place, and time.  Psychiatric:        Behavior: Behavior normal.        Thought Content: Thought content normal.     Vitals:   10/29/18 1725  BP: 115/71  Pulse: 69  Temp: 98.2 F (36.8 C)  TempSrc: Oral  Weight: 169 lb 12.8 oz (77 kg)  Height: 5\' 7"  (1.702 m)       Assessment & Plan:  Alison Kelley is a 58 y.o. female Polyarthralgia  -Reports worsening symptoms in the morning, improves as day goes on.  Multiple joints involved, not just proximal joints.  No joint swelling no rash, doubt inflammatory arthritis, most likely osteoarthritis.  Testing/further evaluation offered but would like to initially try glucosamine/chondroitin as it has shown some benefit.  Recommended follow-up for further testing if not improving into the next 6 weeks.  Sooner if worse.  No orders of the defined types were placed in this encounter.  Patient Instructions       If you have lab work done today you will be contacted  with your lab results within the next 2 weeks.  If you have not heard from [place] then please contact us. The fastest way to get your results is to register for My Chart.   IF you received an x-ray today, you will receive an invoice from [facility]. Please contact [facility] at [phone] with questions or concerns regarding your invoice.   IF you received labwork today, you will receive an invoice from [facility]. Please contact [facility] at [phone] with questions or concerns regarding your invoice.   Our billing staff will not be able to assist you with questions regarding bills from these companies.  You will be contacted with the lab results as soon as they are available. The fastest way to get your results is to activate your My Chart account. Instructions are located on the last page of this paperwork. If you have not heard from [place] regarding the results in 2  weeks, please contact this office.       Signed,   Merri Ray, MD [facility] [place].  10/30/18 10:52 PM

## 2018-10-30 ENCOUNTER — Encounter: Payer: Self-pay | Admitting: Family Medicine

## 2018-12-18 ENCOUNTER — Other Ambulatory Visit: Payer: Self-pay

## 2018-12-18 DIAGNOSIS — Z13220 Encounter for screening for lipoid disorders: Secondary | ICD-10-CM

## 2018-12-18 DIAGNOSIS — Z1389 Encounter for screening for other disorder: Secondary | ICD-10-CM

## 2018-12-18 DIAGNOSIS — Z1329 Encounter for screening for other suspected endocrine disorder: Secondary | ICD-10-CM

## 2018-12-21 ENCOUNTER — Ambulatory Visit (INDEPENDENT_AMBULATORY_CARE_PROVIDER_SITE_OTHER): Payer: BC Managed Care – PPO | Admitting: Family Medicine

## 2018-12-21 ENCOUNTER — Other Ambulatory Visit: Payer: Self-pay

## 2018-12-21 DIAGNOSIS — Z1389 Encounter for screening for other disorder: Secondary | ICD-10-CM | POA: Diagnosis not present

## 2018-12-21 DIAGNOSIS — Z13220 Encounter for screening for lipoid disorders: Secondary | ICD-10-CM

## 2018-12-21 DIAGNOSIS — Z1329 Encounter for screening for other suspected endocrine disorder: Secondary | ICD-10-CM | POA: Diagnosis not present

## 2018-12-21 LAB — POCT URINALYSIS DIP (MANUAL ENTRY)
Bilirubin, UA: NEGATIVE
Glucose, UA: NEGATIVE mg/dL
Ketones, POC UA: NEGATIVE mg/dL
Leukocytes, UA: NEGATIVE
Nitrite, UA: NEGATIVE
Protein Ur, POC: NEGATIVE mg/dL
Spec Grav, UA: 1.015 (ref 1.010–1.025)
Urobilinogen, UA: 0.2 E.U./dL
pH, UA: 7.5 (ref 5.0–8.0)

## 2018-12-22 LAB — LIPID PANEL
Chol/HDL Ratio: 4.6 ratio — ABNORMAL HIGH (ref 0.0–4.4)
Cholesterol, Total: 279 mg/dL — ABNORMAL HIGH (ref 100–199)
HDL: 61 mg/dL (ref >39)
LDL Calculated: 202 mg/dL — ABNORMAL HIGH (ref 0–99)
Triglycerides: 81 mg/dL (ref 0–149)
VLDL Cholesterol Cal: 16 mg/dL (ref 5–40)

## 2018-12-22 LAB — TSH: TSH: 2.23 u[IU]/mL (ref 0.450–4.500)

## 2018-12-25 ENCOUNTER — Encounter: Payer: BC Managed Care – PPO | Admitting: Family Medicine

## 2018-12-25 ENCOUNTER — Other Ambulatory Visit: Payer: Self-pay

## 2018-12-25 NOTE — Progress Notes (Signed)
Appt cancelled

## 2018-12-25 NOTE — Patient Instructions (Signed)
° ° ° °  If you have lab work done today you will be contacted with your lab results within the next 2 weeks.  If you have not heard from us then please contact us. The fastest way to get your results is to register for My Chart. ° ° °IF you received an x-ray today, you will receive an invoice from [facility]. Please contact [facility] at [phone] with questions or concerns regarding your invoice.  ° °IF you received labwork today, you will receive an invoice from [facility]. Please contact [facility] at [phone] with questions or concerns regarding your invoice.  ° °Our billing staff will not be able to assist you with questions regarding bills from these companies. ° °You will be contacted with the lab results as soon as they are available. The fastest way to get your results is to activate your My Chart account. Instructions are located on the last page of this paperwork. If you have not heard from us regarding the results in 2 weeks, please contact this office. °  ° ° ° °

## 2018-12-25 NOTE — Progress Notes (Signed)
CC- Annual Physical-  Patient has webex visit with you today. Patient was informed you may join the meeting before or may even after the time. Patient stated she will try to stay logged on. But if not can be reached at [phone]. Patient also wanted to ask you a question about the poison ivy she have on right arm. Patient was informed you may or may not be able to address this issus today. Patient had labs done here in office on 12/21/18 and they are ready for review.

## 2018-12-26 ENCOUNTER — Telehealth (INDEPENDENT_AMBULATORY_CARE_PROVIDER_SITE_OTHER): Payer: BC Managed Care – PPO | Admitting: Family Medicine

## 2018-12-26 ENCOUNTER — Encounter: Payer: BC Managed Care – PPO | Admitting: Family Medicine

## 2018-12-26 ENCOUNTER — Telehealth: Payer: BC Managed Care – PPO | Admitting: Family Medicine

## 2018-12-26 ENCOUNTER — Other Ambulatory Visit: Payer: Self-pay

## 2018-12-26 DIAGNOSIS — Z0001 Encounter for general adult medical examination with abnormal findings: Secondary | ICD-10-CM | POA: Diagnosis not present

## 2018-12-26 DIAGNOSIS — Z23 Encounter for immunization: Secondary | ICD-10-CM

## 2018-12-26 DIAGNOSIS — E785 Hyperlipidemia, unspecified: Secondary | ICD-10-CM

## 2018-12-26 DIAGNOSIS — L237 Allergic contact dermatitis due to plants, except food: Secondary | ICD-10-CM | POA: Diagnosis not present

## 2018-12-26 DIAGNOSIS — F329 Major depressive disorder, single episode, unspecified: Secondary | ICD-10-CM | POA: Diagnosis not present

## 2018-12-26 DIAGNOSIS — Z0000 Encounter for general adult medical examination without abnormal findings: Secondary | ICD-10-CM

## 2018-12-26 DIAGNOSIS — F32A Depression, unspecified: Secondary | ICD-10-CM

## 2018-12-26 MED ORDER — SIMVASTATIN 20 MG PO TABS: 20.0000 mg | ORAL_TABLET | Freq: Every day | ORAL | 1 refills | Status: DC

## 2018-12-26 MED ORDER — TRIAMCINOLONE ACETONIDE 0.1 % EX CREA: 1.0000 "application " | TOPICAL_CREAM | Freq: Two times a day (BID) | CUTANEOUS | 0 refills | Status: AC

## 2018-12-26 MED ORDER — ZOSTER VAC RECOMB ADJUVANTED 50 MCG/0.5ML IM SUSR: 0.5000 mL | Freq: Once | INTRAMUSCULAR | 1 refills | Status: AC

## 2018-12-26 NOTE — Progress Notes (Signed)
Virtual Visit via Video Note  I connected with Alison Kelley on 12/26/18 at 4:08 PM by a video enabled telemedicine application and verified that I am speaking with the correct person using two identifiers.   I discussed the limitations, risks, security and privacy concerns of performing an evaluation and management service by telephone and the availability of in person appointments. I also discussed with the patient that there may be a patient responsible charge related to this service. The patient expressed understanding and agreed to proceed, consent obtained  Chief complaint:  Physical  History of Present Illness: Alison Kelley is a 58 y.o. female   Depression: Depression screen [facility] 2/9 12/25/2018 10/29/2018 06/15/2018 05/28/2018 03/21/2018  Decreased Interest 0 0 0 0 0  Down, Depressed, Hopeless 0 0 0 0 0  PHQ - 2 Score 0 0 0 0 0  Takes Wellbutrin and Zoloft, followed by psychiatry prior. Feels like current meds are working well. Recently saw psychiatry - [facility].  Normal TSH on recent labs.  No new side effects on current regimen and will have followed her if stable.   Hyperlipidemia:  Lab Results  Component Value Date   CHOL 279 (H) 12/21/2018   HDL 61 12/21/2018   LDLCALC 202 (H) 12/21/2018   LDLDIRECT 172.5 10/30/2009   TRIG 81 12/21/2018   CHOLHDL 4.6 (H) 12/21/2018   Lab Results  Component Value Date   ALT 21 06/15/2018   AST 24 06/15/2018   ALKPHOS 93 06/15/2018   BILITOT 0.2 06/15/2018   Has been on statin in the past but had stopped that when she converted to mainly vegan diet, but some occasional dairy. discussed at her physical in April 2019.  10-year ASCVD risk not high enough to recommend statin last year, however significantly elevated at 202 on recent testing. The 10-year ASCVD risk score Mikey Bussing DC Brooke Bonito., et al., 2013) is: 2.3%   Values used to calculate the score:     Age: 62 years     Sex: Female     Is Non-Hispanic African American: No  Diabetic: No     Tobacco smoker: No     Systolic Blood Pressure: 154 mmHg     Is BP treated: No     HDL Cholesterol: 61 mg/dL     Total Cholesterol: 279 mg/dL Range of LDL since 2011 103- 191 until recent testing.  No cardiac FH in first degree relatives.    Cancer screening Colonoscopy 04/24/2016 by Dr. Havery Moros, planned repeat 5 years. Mammogram 06/23/2018 Pap testing 12/21/2017 -  IUD in 2013? Will follow up in next 2-77months  History of postmenopausal dyspareunia, dryness, has used Premarin vaginal cream in the past - works well - only uses topically about 1-2 times per week.   Immunization History Administered Date(s) Administered . Hepatitis A 06/28/2006 . Influenza Whole 07/11/2002 . Influenza,inj,Quad PF,6+ Mos 05/28/2018 . Influenza-Unspecified 06/19/2016 . Td 06/28/2006 . Tdap 12/16/2015 Shingles vaccine: has not had.   Optho: Last appt in January.   Dental:every 6 months.   Exercise: cardio, yoga, some strength training.  4-5 days week usually.   Poison ivy? Noticed after yardwork,  Noticed on chest, arms about a week and a half ago.  No face or genital lesions  Better today than yesterday  Tx: ivyrest. Topical itch relief cream.   Results for orders placed or performed in visit on 12/21/18  Lipid panel  Result Value Ref Range   Cholesterol, Total 279 (H) 100 - 199 mg/dL   Triglycerides 81  0 - 149 mg/dL   HDL 61 >39 mg/dL   VLDL Cholesterol Cal 16 5 - 40 mg/dL   LDL Calculated 202 (H) 0 - 99 mg/dL   Comment: Comment    Chol/HDL Ratio 4.6 (H) 0.0 - 4.4 ratio  TSH  Result Value Ref Range   TSH 2.230 0.450 - 4.500 uIU/mL  POCT urinalysis dipstick  Result Value Ref Range   Color, UA yellow yellow   Clarity, UA cloudy (A) clear   Glucose, UA negative negative mg/dL   Bilirubin, UA negative negative   Ketones, POC UA negative negative mg/dL   Spec Grav, UA 1.015 1.010 - 1.025   Blood, UA trace-intact (A) negative   pH, UA 7.5 5.0 - 8.0   Protein  Ur, POC negative negative mg/dL   Urobilinogen, UA 0.2 0.2 or 1.0 E.U./dL   Nitrite, UA Negative Negative   Leukocytes, UA Negative Negative     Patient Active Problem List   Diagnosis Date Noted  . FATIGUE, CHRONIC 11/12/2008  . HYPERLIPIDEMIA 04/27/2007  . DEPRESSION 04/27/2007  . ADHD 04/27/2007  . FIBROCYSTIC BREAST DISEASE 04/27/2007   Past Medical History:  Diagnosis Date  . Anemia   . Anxiety   . Arthritis 1978-1980   right knee  . Depression   . High cholesterol   . Seizures ([place]) 2010    febrile x1   Past Surgical History:  Procedure Laterality Date  . CESAREAN SECTION    . DILATION AND CURETTAGE OF UTERUS     No Known Allergies Prior to Admission medications   Medication Sig Start Date End Date Taking? Authorizing Provider  buPROPion (WELLBUTRIN XL) 300 MG 24 hr tablet Take 1 tablet (300 mg total) by mouth daily. 07/21/16  Yes Jaynee Eagles, PA-C  conjugated estrogens (PREMARIN) vaginal cream Apply PV 1-3 times per week as needed. 12/21/17  Yes [name], MD  sertraline (ZOLOFT) 100 MG tablet Take 1 tablet (100 mg total) by mouth daily. 07/21/16  Yes Jaynee Eagles, PA-C   Social History   Socioeconomic History  . Marital status: Married    Spouse name: Not on file  . Number of children: Not on file  . Years of education: Not on file  . Highest education level: Not on file  Occupational History  . Occupation: Pharmacist, hospital  Social Needs  . Financial resource strain: Not on file  . Food insecurity:    Worry: Not on file    Inability: Not on file  . Transportation needs:    Medical: Not on file    Non-medical: Not on file  Tobacco Use  . Smoking status: Never Smoker  . Smokeless tobacco: Never Used  Substance and Sexual Activity  . Alcohol use: Yes    Alcohol/week: 0.0 standard drinks    Comment: rarely wine  . Drug use: No  . Sexual activity: Yes    Partners: Male    Birth control/protection: I.U.D.  Lifestyle  . Physical activity:    Days per  week: Not on file    Minutes per session: Not on file  . Stress: Not on file  Relationships  . Social connections:    Talks on phone: Not on file    Gets together: Not on file    Attends religious service: Not on file    Active member of club or organization: Not on file    Attends meetings of clubs or organizations: Not on file    Relationship status: Not on file  .  Intimate partner violence:    Fear of current or ex partner: Not on file    Emotionally abused: Not on file    Physically abused: Not on file    Forced sexual activity: Not on file  Other Topics Concern  . Not on file  Social History Narrative   Lives with her husband.   Two sons live there on the weekends, and an older child when he is home from school in the summers.    Observations/Objective: Normal responses by video, no distress.  Unable to visualize specific rash.   Assessment and Plan: Annual physical exam  - -anticipatory guidance as below in AVS, screening labs above. Health maintenance items as above in HPI discussed/recommended as applicable.   Need for shingles vaccine - Plan: Zoster Vaccine Adjuvanted [facility]) injection  -Sent to pharmacy  Hyperlipidemia, unspecified hyperlipidemia type - Plan: simvastatin (ZOCOR) 20 MG tablet, Lipid panel, Comprehensive metabolic panel  -Options discussed given current readings.  LDL has been elevated in the past but not as severe as recently.  Based on recent reading suspicious for familial hyperlipidemia.  After discussion of risks and benefits of medications and treatment options, decided to try Zocor 20 mg daily, with repeat testing in 6 to 8 weeks.  Depression, unspecified depression type  -Stable, has been on same regimen for a while.  Can refill here in the future as long as symptoms have remained stable.  Poison ivy dermatitis - Plan: triamcinolone cream (KENALOG) 0.1 %  -Improving.  Treatment options discussed in the future, continue IV dry, triamcinolone  cream provided if needed for affected areas.  Follow Up Instructions:    I discussed the assessment and treatment plan with the patient. The patient was provided an opportunity to ask questions and all were answered. The patient agreed with the plan and demonstrated an understanding of the instructions.   The patient was advised to call back or seek an in-person evaluation if the symptoms worsen or if the condition fails to improve as anticipated.  I provided 25 minutes of non-face-to-face time during this encounter.   [name], MD

## 2018-12-26 NOTE — Patient Instructions (Addendum)
Okay to continue the over-the-counter treatments for poison ivy, but I sent some steroid cream to the pharmacy that can be used to those itching areas up to twice per day, or use for the next flare of poison ivy if needed.  Try simvastatin once per day for cholesterol and follow-up in the next 2 to 3 months for repeat testing and discussion.  We can also discuss the Mirena at that time.    Shingles vaccine was sent to your pharmacy when you are ready.  Let me know if there are further questions from the visit today.  Take care.    Poison Ivy Dermatitis  Poison ivy dermatitis is inflammation of the skin that is caused by the allergens on the leaves of the poison ivy plant. The skin reaction often involves redness, swelling, blisters, and extreme itching. What are the causes? This condition is caused by a specific chemical (urushiol) found in the sap of the poison ivy plant. This chemical is sticky and can be easily spread to people, animals, and objects. You can get poison ivy dermatitis by:  Having direct contact with a poison ivy plant.  Touching animals, other people, or objects that have come in contact with poison ivy and have the chemical on them. What increases the risk? This condition is more likely to develop in:  People who are outdoors often.  People who go outdoors without wearing protective clothing, such as closed shoes, long pants, and a long-sleeved shirt. What are the signs or symptoms? Symptoms of this condition include:  Redness and itching.  A rash that often includes bumps and blisters. The rash usually appears 48 hours after exposure.  Swelling. This may occur if the reaction is more severe. Symptoms usually last for 1-2 weeks. However, the first time you develop this condition, symptoms may last 3-4 weeks. How is this diagnosed? This condition may be diagnosed based on your symptoms and a physical exam. Your health care provider may also ask you about any  recent outdoor activity. How is this treated? Treatment for this condition will vary depending on how severe it is. Treatment may include:  Hydrocortisone creams or calamine lotions to relieve itching.  Oatmeal baths to soothe the skin.  Over-the-counter antihistamine tablets.  Oral steroid medicine for more severe outbreaks. Follow these instructions at home:  Take or apply over-the-counter and prescription medicines only as told by your health care provider.  Wash exposed skin as soon as possible with soap and cold water.  Use hydrocortisone creams or calamine lotion as needed to soothe the skin and relieve itching.  Take oatmeal baths as needed. Use colloidal oatmeal. You can get this at your local pharmacy or grocery store. Follow the instructions on the packaging.  Do not scratch or rub your skin.  While you have the rash, wash clothes right after you wear them. How is this prevented?   Learn to identify the poison ivy plant and avoid contact with the plant. This plant can be recognized by the number of leaves. Generally, poison ivy has three leaves with flowering branches on a single stem. The leaves are typically glossy, and they have jagged edges that come to a point at the front.  If you have been exposed to poison ivy, thoroughly wash with soap and water right away. You have about 30 minutes to remove the plant resin before it will cause the rash. Be sure to wash under your fingernails because any plant resin there will continue to spread  the rash.  When hiking or camping, wear clothes that will help you to avoid exposure on the skin. This includes long pants, a long-sleeved shirt, tall socks, and hiking boots. You can also apply preventive lotion to your skin to help limit exposure.  If you suspect that your clothes or outdoor gear came in contact with poison ivy, rinse them off outside with a garden hose before you bring them inside your house. Contact a health care  provider if:  You have open sores in the rash area.  You have more redness, swelling, or pain in the affected area.  You have redness that spreads beyond the rash area.  You have fluid, blood, or pus coming from the affected area.  You have a fever.  You have a rash over a large area of your body.  You have a rash on your eyes, mouth, or genitals.  Your rash does not improve after a few days. Get help right away if:  Your face swells or your eyes swell shut.  You have trouble breathing.  You have trouble swallowing. This information is not intended to replace advice given to you by your health care provider. Make sure you discuss any questions you have with your health care provider. Document Released: 08/19/2000 Document Revised: 02/02/2017 Document Reviewed: 01/28/2015 Elsevier Interactive Patient Education  2019 [place] Healthy  Get These Tests  Blood Pressure- Have your blood pressure checked by your healthcare provider at least once a year.  Normal blood pressure is 120/80.  Weight- Have your body mass index (BMI) calculated to screen for obesity.  BMI is a measure of body fat based on height and weight.  You can calculate your own BMI at [employer]  Cholesterol- Have your cholesterol checked every year.  Diabetes- Have your blood sugar checked every year if you have high blood pressure, high cholesterol, a family history of diabetes or if you are overweight.  Pap Test - Have a pap test every 1 to 5 years if you have been sexually active.  If you are older than 65 and recent pap tests have been normal you may not need additional pap tests.  In addition, if you have had a hysterectomy  for benign disease additional pap tests are not necessary.  Mammogram-Yearly mammograms are essential for early detection of breast cancer  Screening for Colon Cancer- Colonoscopy starting at age 50. Screening may begin sooner depending on your family  history and other health conditions.  Follow up colonoscopy as directed by your Gastroenterologist.  Screening for Osteoporosis- Screening begins at age 73 with bone density scanning, sooner if you are at higher risk for developing Osteoporosis.  Get these medicines  Calcium with Vitamin D- Your body requires 1200-1500 mg of Calcium a day and [phone] IU of Vitamin D a day.  You can only absorb 500 mg of Calcium at a time therefore Calcium must be taken in 2 or 3 separate doses throughout the day.  Hormones- Hormone therapy has been associated with increased risk for certain cancers and heart disease.  Talk to your healthcare provider about if you need relief from menopausal symptoms.  Aspirin- Ask your healthcare provider about taking Aspirin to prevent Heart Disease and Stroke.  Get these Immuniztions  Flu shot- Every fall  Pneumonia shot- Once after the age of 23; if you are younger ask your healthcare provider if you need a pneumonia shot.  Tetanus- Every ten years.  Zostavax- Once after the  age of 37 to prevent shingles.  Take these steps  Don't smoke- Your healthcare provider can help you quit. For tips on how to quit, ask your healthcare provider or go to www.smokefree.gov or call 1-800 QUIT-NOW.  Be physically active- Exercise 5 days a week for a minimum of 30 minutes.  If you are not already physically active, start slow and gradually work up to 30 minutes of moderate physical activity.  Try walking, dancing, bike riding, swimming, etc.  Eat a healthy diet- Eat a variety of healthy foods such as fruits, vegetables, whole grains, low fat milk, low fat cheeses, yogurt, lean meats, chicken, fish, eggs, dried beans, tofu, etc.  For more information go to www.thenutritionsource.org  Dental visit- Brush and floss teeth twice daily; visit your dentist twice a year.  Eye exam- Visit your Optometrist or Ophthalmologist yearly.  Drink alcohol in moderation- Limit alcohol intake to  one drink or less a day.  Never drink and drive.  Depression- Your emotional health is as important as your physical health.  If you're feeling down or losing interest in things you normally enjoy, please talk to your healthcare provider.  Seat Belts- can save your life; always wear one  Smoke/Carbon Monoxide detectors- These detectors need to be installed on the appropriate level of your home.  Replace batteries at least once a year.  Violence- If anyone is threatening or hurting you, please tell your healthcare provider.  Living Will/ Health care power of attorney- Discuss with your healthcare provider and family.   If you have lab work done today you will be contacted with your lab results within the next 2 weeks.  If you have not heard from [place] then please contact us. The fastest way to get your results is to register for My Chart.   IF you received an x-ray today, you will receive an invoice from [facility]. Please contact [facility] at [phone] with questions or concerns regarding your invoice.   IF you received labwork today, you will receive an invoice from [facility]. Please contact [facility] at [phone] with questions or concerns regarding your invoice.   Our billing staff will not be able to assist you with questions regarding bills from these companies.  You will be contacted with the lab results as soon as they are available. The fastest way to get your results is to activate your My Chart account. Instructions are located on the last page of this paperwork. If you have not heard from [place] regarding the results in 2 weeks, please contact this office.

## 2018-12-26 NOTE — Progress Notes (Signed)
CC- Annual Physical- Patient was scheduled yesterday but had to reschedule. Patient also had a question about poison ivy on right arm. Patient was informed you may or may not address this today. Patient had labs done in office on 12/21/2018 and is ready for review.

## 2019-01-31 ENCOUNTER — Other Ambulatory Visit: Payer: Self-pay | Admitting: Family Medicine

## 2019-01-31 DIAGNOSIS — N951 Menopausal and female climacteric states: Secondary | ICD-10-CM

## 2019-01-31 NOTE — Telephone Encounter (Signed)
Requested medication (s) are due for refill today:yes  Requested medication (s) are on the active medication list: yes  Last refill:  12/21/17  Future visit scheduled: No needs physical  Notes to clinic:  Expired rx    Requested Prescriptions  Pending Prescriptions Disp Refills   PREMARIN vaginal cream [Pharmacy Med Name: Premarin 0.625 MG/GM Vaginal Cream] 30 g 0    Sig: APPLY 1 TO 3 TIMES VAGINALLY PER WEEK AS NEEDED     OB/GYN:  Estrogens Passed - 01/31/2019  3:07 PM      Passed - Mammogram is up-to-date per Health Maintenance      Passed - Last BP in normal range    BP Readings from Last 1 Encounters:  10/29/18 115/71         Passed - Valid encounter within last 12 months    Recent Outpatient Visits          1 month ago Screening for hematuria or proteinuria   [facility], Ranell Patrick, MD   3 months ago Polyarthralgia   [facility], Ranell Patrick, MD   7 months ago Lymphadenopathy, cervical   [facility], Ines Bloomer, MD   8 months ago Cervical pain (neck)   [facility] at [facility], Fenton Malling, MD   10 months ago Cough   Primary Care at [facility], [place], [place]

## 2019-03-22 IMAGING — MG DIGITAL SCREENING BILATERAL MAMMOGRAM WITH TOMO AND CAD
8 series · 8 of 24 positions shown · non-contrast
Comparison: Previous exam(s).

CLINICAL DATA: Screening.

EXAM:
DIGITAL SCREENING BILATERAL MAMMOGRAM WITH TOMO AND CAD

[L MLO synth-2D]
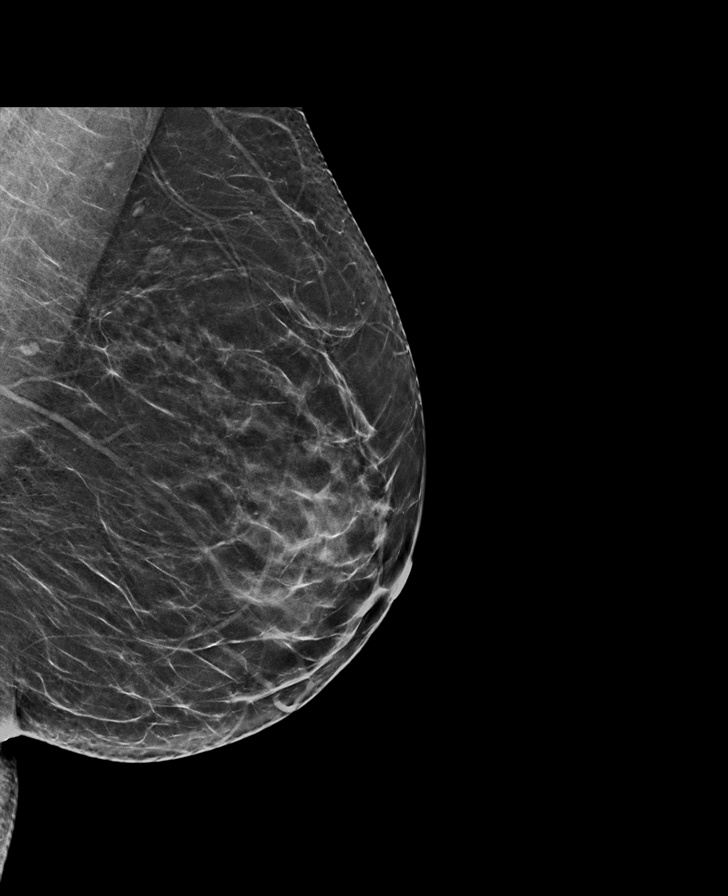

[R MLO synth-2D]
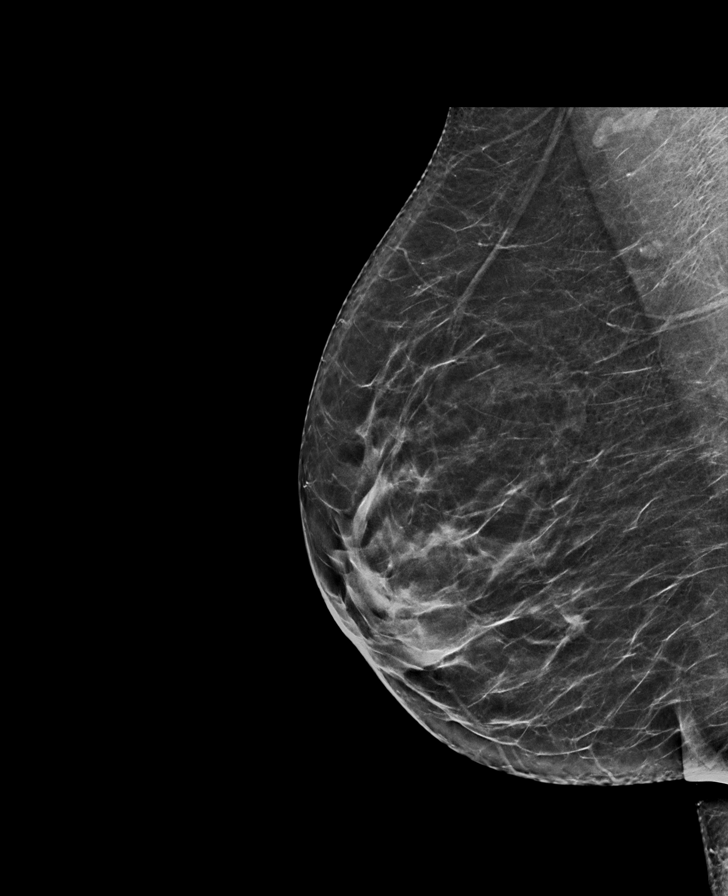

[R CC synth-2D]
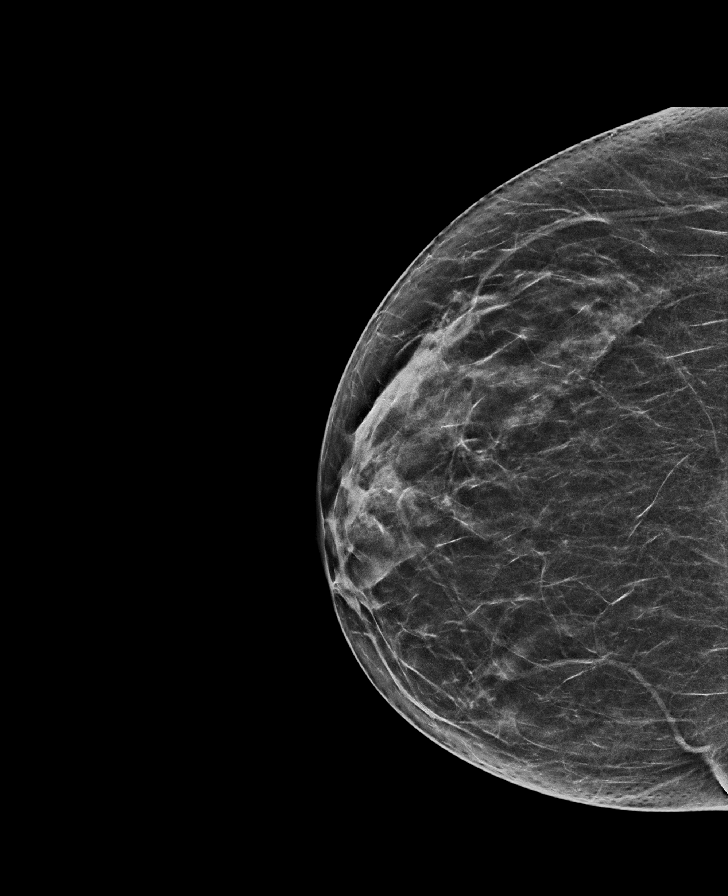

[L CC synth-2D]
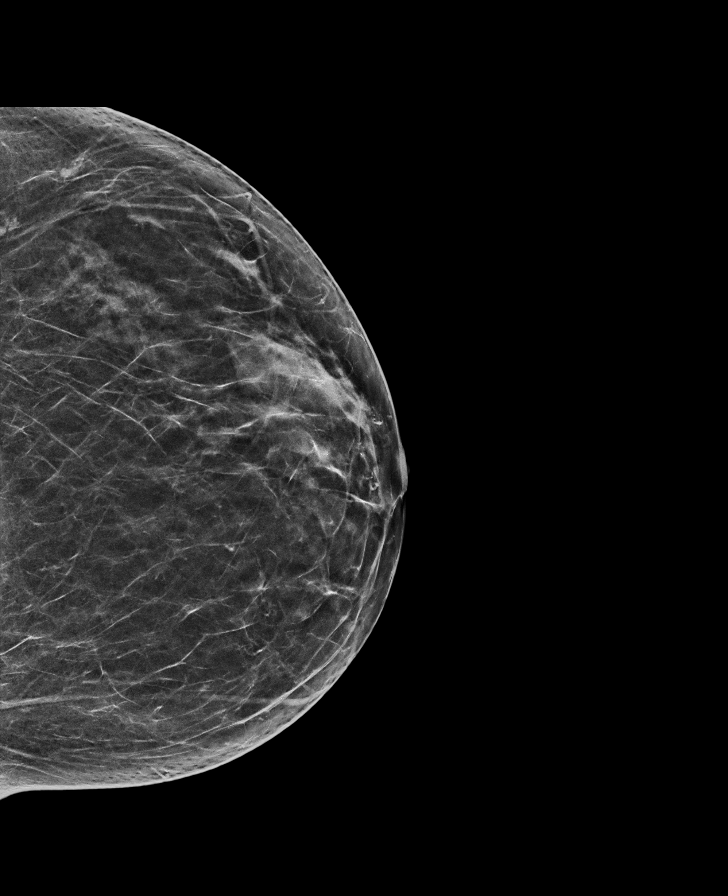

[L CC tomo · tomo slice 31/62.0]
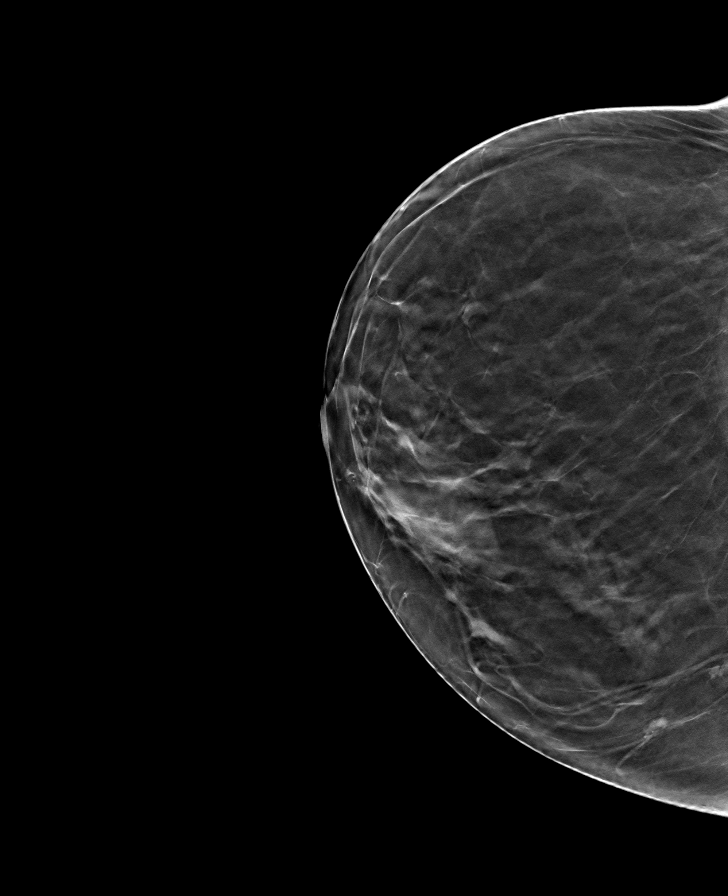

[R MLO tomo · tomo slice 33/66.0]
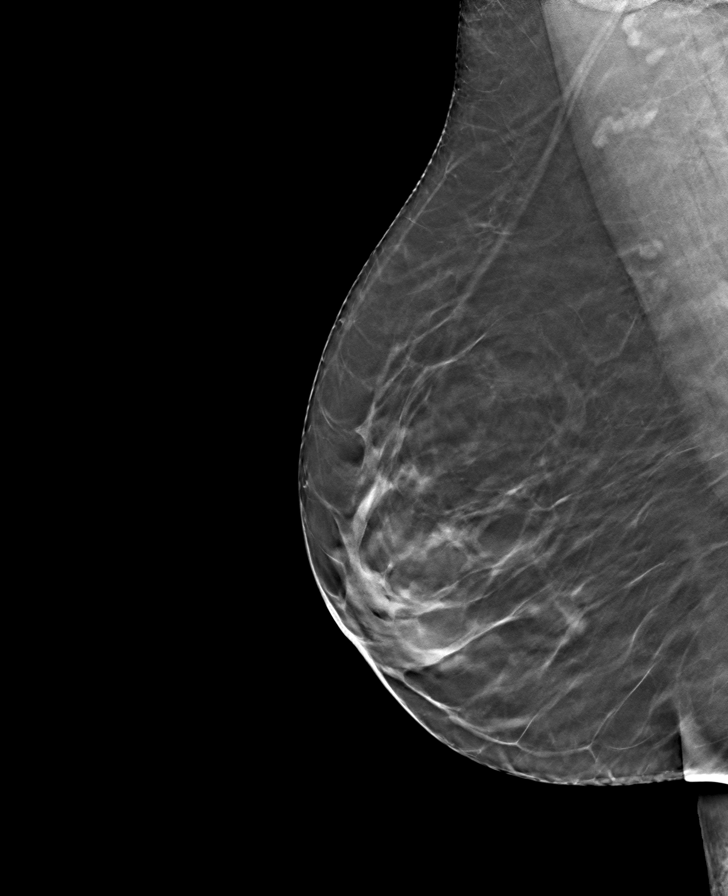

[L MLO tomo · tomo slice 32/63.0]
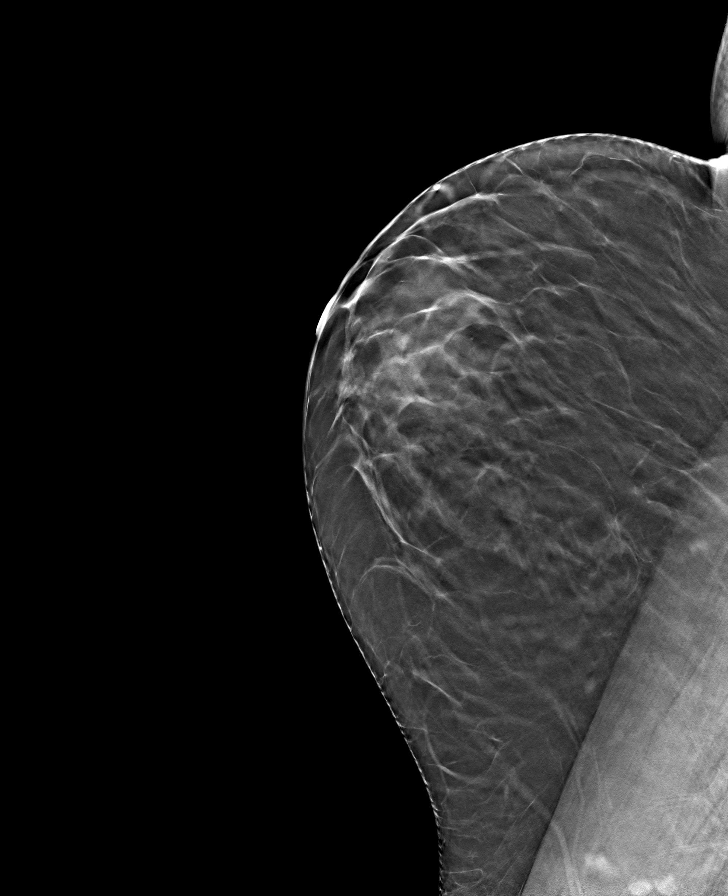

[R CC tomo · tomo slice 31/60.0]
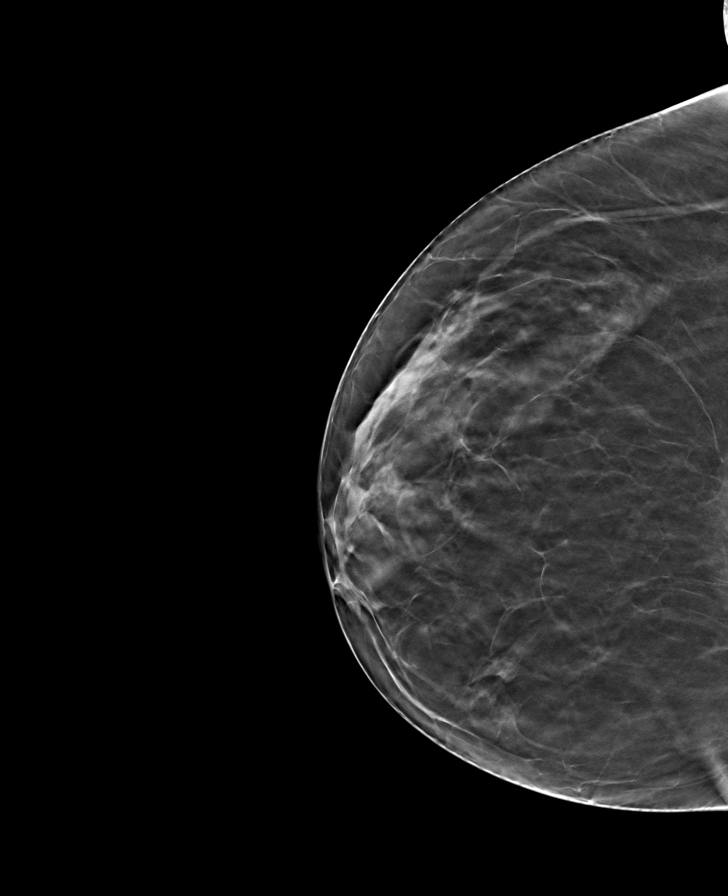

[8 of 24 positions shown; findings below may reference images not displayed]

ACR Breast Density Category b: There are scattered areas of
fibroglandular density.
FINDINGS: There are no findings suspicious for malignancy. Images were
processed with CAD.
IMPRESSION: No mammographic evidence of malignancy. A result letter of this
screening mammogram will be mailed directly to the patient.

RECOMMENDATION:
Screening mammogram in one year. (Code:[id number])

BI-RADS CATEGORY  1: Negative.

## 2019-04-24 ENCOUNTER — Encounter: Payer: Self-pay | Admitting: Emergency Medicine

## 2019-04-24 ENCOUNTER — Other Ambulatory Visit: Payer: Self-pay

## 2019-04-24 ENCOUNTER — Telehealth (INDEPENDENT_AMBULATORY_CARE_PROVIDER_SITE_OTHER): Payer: BC Managed Care – PPO | Admitting: Emergency Medicine

## 2019-04-24 VITALS — Temp 98.4°F | Ht 67.0 in | Wt 165.0 lb

## 2019-04-24 DIAGNOSIS — R51 Headache: Secondary | ICD-10-CM

## 2019-04-24 DIAGNOSIS — R52 Pain, unspecified: Secondary | ICD-10-CM | POA: Diagnosis not present

## 2019-04-24 DIAGNOSIS — R519 Headache, unspecified: Secondary | ICD-10-CM

## 2019-04-24 DIAGNOSIS — R197 Diarrhea, unspecified: Secondary | ICD-10-CM

## 2019-04-24 NOTE — Progress Notes (Signed)
Loose Bowls  Lower Gi Issues   Sx going on since Sunday.   On a stay home order from work.

## 2019-04-24 NOTE — Progress Notes (Signed)
Telemedicine Encounter- SOAP NOTE Established Patient  This telephone encounter was conducted with the patient's (or proxy's) verbal consent via audio telecommunications: yes/no: Yes Patient was instructed to have this encounter in a suitably private space; and to only have persons present to whom they give permission to participate. In addition, patient identity was confirmed by use of name plus two identifiers (DOB and address).  I discussed the limitations, risks, security and privacy concerns of performing an evaluation and management service by telephone and the availability of in person appointments. I also discussed with the patient that there may be a patient responsible charge related to this service. The patient expressed understanding and agreed to proceed.  I spent a total of TIME; 0 MIN TO 60 MIN: 15 minutes talking with the patient or their proxy.  No chief complaint on file. With diarrhea, headaches, body aches  Subjective   Alison Kelley is a 58 y.o. female established patient. Telephone visit today complaining of watery nonbloody diarrhea, generalized headache and body aches that started 3 to 4 days ago and they are slowly getting better.  Works at [employer] and testing for [place] is highly recommended.  No other significant symptoms.  No fever or chills and no respiratory symptoms.  HPI   Patient Active Problem List   Diagnosis Date Noted  . FATIGUE, CHRONIC 11/12/2008  . HYPERLIPIDEMIA 04/27/2007  . DEPRESSION 04/27/2007  . ADHD 04/27/2007  . FIBROCYSTIC BREAST DISEASE 04/27/2007    Past Medical History:  Diagnosis Date  . Anemia   . Anxiety   . Arthritis 1978-1980   right knee  . Depression   . High cholesterol   . Seizures ([place]) 2010    febrile x1    Current Outpatient Medications  Medication Sig Dispense Refill  . buPROPion (WELLBUTRIN XL) 300 MG 24 hr tablet Take 1 tablet (300 mg total) by mouth daily. 90 tablet 3  . conjugated  estrogens (PREMARIN) vaginal cream APPLY 1 TO 3 TIMES VAGINALLY PER WEEK AS NEEDED 30 g 0  . ferrous sulfate 325 (65 FE) MG tablet Take 325 mg by mouth daily with breakfast.    . sertraline (ZOLOFT) 100 MG tablet Take 1 tablet (100 mg total) by mouth daily. 90 tablet 3  . simvastatin (ZOCOR) 20 MG tablet Take 1 tablet (20 mg total) by mouth at bedtime. 90 tablet 1  . triamcinolone cream (KENALOG) 0.1 % Apply 1 application topically 2 (two) times daily. 30 g 0   No current facility-administered medications for this visit.     No Known Allergies  Social History   Socioeconomic History  . Marital status: Married    Spouse name: Not on file  . Number of children: Not on file  . Years of education: Not on file  . Highest education level: Not on file  Occupational History  . Occupation: Pharmacist, hospital  Social Needs  . Financial resource strain: Not on file  . Food insecurity    Worry: Not on file    Inability: Not on file  . Transportation needs    Medical: Not on file    Non-medical: Not on file  Tobacco Use  . Smoking status: Never Smoker  . Smokeless tobacco: Never Used  Substance and Sexual Activity  . Alcohol use: Yes    Alcohol/week: 0.0 standard drinks    Comment: rarely wine  . Drug use: No  . Sexual activity: Yes    Partners: Male    Birth control/protection: I.U.D.  Lifestyle  . Physical activity    Days per week: Not on file    Minutes per session: Not on file  . Stress: Not on file  Relationships  . Social Herbalist on phone: Not on file    Gets together: Not on file    Attends religious service: Not on file    Active member of club or organization: Not on file    Attends meetings of clubs or organizations: Not on file    Relationship status: Not on file  . Intimate partner violence    Fear of current or ex partner: Not on file    Emotionally abused: Not on file    Physically abused: Not on file    Forced sexual activity: Not on file  Other Topics  Concern  . Not on file  Social History Narrative   Lives with her husband.   Two sons live there on the weekends, and an older child when he is home from school in the summers.    Review of Systems  Constitutional: Negative.  Negative for chills and fever.  HENT: Negative.  Negative for congestion and sore throat.   Eyes: Negative.   Respiratory: Negative.  Negative for cough, shortness of breath and wheezing.   Cardiovascular: Negative.  Negative for chest pain and palpitations.  Gastrointestinal: Positive for diarrhea. Negative for blood in stool, melena, nausea and vomiting.  Genitourinary: Negative.   Musculoskeletal: Positive for myalgias.  Skin: Negative.  Negative for rash.  Neurological: Positive for headaches.  All other systems reviewed and are negative.   Objective   Vitals as reported by the patient: Today's Vitals   04/24/19 1458  Temp: 98.4 F (36.9 C)  TempSrc: Oral  Weight: 165 lb (74.8 kg)  Height: 5\' 7"  (1.702 m)    There are no diagnoses linked to this encounter.  Diagnoses and all orders for this visit:  Diarrhea of presumed infectious origin -     SARS-CoV-2 Antibody, IgM  Generalized headache -     SARS-CoV-2 Antibody, IgM  Body aches -     SARS-CoV-2 Antibody, IgM   Clinically stable.  COVID testing highly recommended.  Return to work depends on resolution of symptoms and COVID test results. COVID precautions given.  I discussed the assessment and treatment plan with the patient. The patient was provided an opportunity to ask questions and all were answered. The patient agreed with the plan and demonstrated an understanding of the instructions.   The patient was advised to call back or seek an in-person evaluation if the symptoms worsen or if the condition fails to improve as anticipated.  I provided 15 minutes of non-face-to-face time during this encounter.  Horald Pollen, MD  Primary Care at [facility]

## 2019-05-14 ENCOUNTER — Encounter: Payer: Self-pay | Admitting: Family Medicine

## 2019-05-14 DIAGNOSIS — F3289 Other specified depressive episodes: Secondary | ICD-10-CM

## 2019-05-15 MED ORDER — SERTRALINE HCL 100 MG PO TABS: 100.0000 mg | ORAL_TABLET | Freq: Every day | ORAL | 1 refills | Status: DC

## 2019-07-12 ENCOUNTER — Telehealth: Payer: BC Managed Care – PPO | Admitting: Nurse Practitioner

## 2019-07-12 DIAGNOSIS — Z20822 Contact with and (suspected) exposure to covid-19: Secondary | ICD-10-CM

## 2019-07-12 DIAGNOSIS — R519 Headache, unspecified: Secondary | ICD-10-CM

## 2019-07-12 DIAGNOSIS — R52 Pain, unspecified: Secondary | ICD-10-CM

## 2019-07-12 NOTE — Progress Notes (Signed)
E-Visit for Corona Virus Screening   Your current symptoms could be consistent with the coronavirus.  Many health care providers can now test patients at their office but not all are.  [facility] has multiple testing sites. For information on our COVID testing locations and hours go to [email or web address]  Please quarantine yourself while awaiting your test results.  We are enrolling you in our MyChart Home Montioring for COVID19 . Daily you will receive a questionnaire within the MyChart website. Our COVID 19 response team willl be monitoriing your responses daily.    COVID-19 is a respiratory illness with symptoms that are similar to the flu. Symptoms are typically mild to moderate, but there have been cases of severe illness and death due to the virus. The following symptoms may appear 2-14 days after exposure: . Fever . Cough . Shortness of breath or difficulty breathing . Chills . Repeated shaking with chills . Muscle pain . Headache . Sore throat . New loss of taste or smell . Fatigue . Congestion or runny nose . Nausea or vomiting . Diarrhea  It is vitally important that if you feel that you have an infection such as this virus or any other virus that you stay home and away from places where you may spread it to others.  You should self-quarantine for 14 days if you have symptoms that could potentially be coronavirus or have been in close contact a with a person diagnosed with COVID-19 within the last 2 weeks. You should avoid contact with people age 65 and older.   You should wear a mask or cloth face covering over your nose and mouth if you must be around other people or animals, including pets (even at home). Try to stay at least 6 feet away from other people. This will protect the people around you.  You may also take acetaminophen (Tylenol) as needed for fever.   Reduce your risk of any infection by using the same precautions used for avoiding the  common cold or flu:  . Wash your hands often with soap and warm water for at least 20 seconds.  If soap and water are not readily available, use an alcohol-based hand sanitizer with at least 60% alcohol.  . If coughing or sneezing, cover your mouth and nose by coughing or sneezing into the elbow areas of your shirt or coat, into a tissue or into your sleeve (not your hands). . Avoid shaking hands with others and consider head nods or verbal greetings only. . Avoid touching your eyes, nose, or mouth with unwashed hands.  . Avoid close contact with people who are sick. . Avoid places or events with large numbers of people in one location, like concerts or sporting events. . Carefully consider travel plans you have or are making. . If you are planning any travel outside or inside the [place], visit the CDC's Travelers' Health webpage for the latest health notices. . If you have some symptoms but not all symptoms, continue to monitor at home and seek medical attention if your symptoms worsen. . If you are having a medical emergency, call 911.  HOME CARE . Only take medications as instructed by your medical team. . Drink plenty of fluids and get plenty of rest. . A steam or ultrasonic humidifier can help if you have congestion.   GET HELP RIGHT AWAY IF YOU HAVE EMERGENCY WARNING SIGNS** FOR COVID-19. If you or someone is showing any of these signs seek emergency medical care immediately. Call   911 or proceed to your closest emergency facility if: . You develop worsening high fever. . Trouble breathing . Bluish lips or face . Persistent pain or pressure in the chest . New confusion . Inability to wake or stay awake . You cough up blood. . Your symptoms become more severe  **This list is not all possible symptoms. Contact your medical provider for any symptoms that are sever or concerning to you.   MAKE SURE YOU   Understand these instructions.  Will watch your condition.  Will get help right  away if you are not doing well or get worse.  Your e-visit answers were reviewed by a board certified advanced clinical practitioner to complete your personal care plan.  Depending on the condition, your plan could have included both over the counter or prescription medications.  If there is a problem please reply once you have received a response from your provider.  Your safety is important to [place].  If you have drug allergies check your prescription carefully.    You can use MyChart to ask questions about today's visit, request a non-urgent call back, or ask for a work or school excuse for 24 hours related to this e-Visit. If it has been greater than 24 hours you will need to follow up with your provider, or enter a new e-Visit to address those concerns. You will get an e-mail in the next two days asking about your experience.  I hope that your e-visit has been valuable and will speed your recovery. Thank you for using e-visits.   I have spent at least 5 minutes reviewing and documenting in the patient's chart.

## 2019-09-04 ENCOUNTER — Other Ambulatory Visit: Payer: Self-pay | Admitting: Family Medicine

## 2019-09-04 DIAGNOSIS — E785 Hyperlipidemia, unspecified: Secondary | ICD-10-CM

## 2019-12-18 ENCOUNTER — Other Ambulatory Visit: Payer: Self-pay | Admitting: Endocrinology

## 2019-12-18 ENCOUNTER — Encounter: Payer: Self-pay | Admitting: Family Medicine

## 2019-12-18 DIAGNOSIS — Z1231 Encounter for screening mammogram for malignant neoplasm of breast: Secondary | ICD-10-CM

## 2019-12-19 ENCOUNTER — Encounter: Payer: Self-pay | Admitting: Emergency Medicine

## 2019-12-25 ENCOUNTER — Ambulatory Visit: Payer: BC Managed Care – PPO

## 2020-01-06 ENCOUNTER — Ambulatory Visit: Payer: BC Managed Care – PPO

## 2020-03-17 ENCOUNTER — Other Ambulatory Visit: Payer: Self-pay

## 2020-03-17 ENCOUNTER — Ambulatory Visit
Admission: RE | Admit: 2020-03-17 | Discharge: 2020-03-17 | Disposition: A | Discharge status: Home / Self Care | Payer: BC Managed Care – PPO | Source: Ambulatory Visit | Attending: Endocrinology | Admitting: Endocrinology

## 2020-03-17 DIAGNOSIS — Z1231 Encounter for screening mammogram for malignant neoplasm of breast: Secondary | ICD-10-CM

## 2020-04-01 ENCOUNTER — Other Ambulatory Visit: Payer: Self-pay

## 2020-04-01 ENCOUNTER — Ambulatory Visit (INDEPENDENT_AMBULATORY_CARE_PROVIDER_SITE_OTHER): Payer: BC Managed Care – PPO | Admitting: Obstetrics & Gynecology

## 2020-04-01 ENCOUNTER — Encounter: Payer: Self-pay | Admitting: Obstetrics & Gynecology

## 2020-04-01 VITALS — BP 115/69 | HR 77 | Ht 67.0 in | Wt 166.2 lb

## 2020-04-01 DIAGNOSIS — N952 Postmenopausal atrophic vaginitis: Secondary | ICD-10-CM

## 2020-04-01 DIAGNOSIS — Z30432 Encounter for removal of intrauterine contraceptive device: Secondary | ICD-10-CM | POA: Diagnosis not present

## 2020-04-01 MED ORDER — PROGESTERONE MICRONIZED 100 MG PO CAPS: 100.0000 mg | ORAL_CAPSULE | Freq: Every day | ORAL | 6 refills | Status: AC

## 2020-04-01 NOTE — Progress Notes (Signed)
[name] pt presents to discuss Mirena IUD removal.  IUD inserted 2013? Normal pap 12/2019 per pt  Pt requests rf on Premarin cream  PHQ-9 = 1  G3P2012 No LMP recorded. (Menstrual status: IUD). Her PCP couldn't remove her IUD and she is referred for this. She uses Premarin cream for vaginal atrophy symptoms and wants to continue this.   GYNECOLOGY OFFICE PROCEDURE NOTE  Alison Kelley is a 59 y.o. [id number] here for [place] IUD removal. No GYN concerns.  Last pap smear was on 2021 and was normal.  IUD Removal  Patient identified, informed consent performed, consent signed.  Patient was in the dorsal lithotomy position, normal external genitalia was noted.  A speculum was placed in the patient's vagina, normal discharge was noted, no lesions. The cervix was visualized, no lesions, no abnormal discharge.  The strings of the IUD were not visualized, so Kelly forceps were introduced into the endometrial cavity. The cervix was dilated and the IUD hook was used;  IUD was grasped and removed in its entirety.  Patient tolerated the procedure well.     She will have Prometrium 100 mg daily to use with her estrogen cream for endometrial protection  Woodroe Mode, MD [place], [facility] for [facility], [place]

## 2020-04-02 ENCOUNTER — Other Ambulatory Visit: Payer: Self-pay | Admitting: Family Medicine

## 2020-04-02 DIAGNOSIS — N951 Menopausal and female climacteric states: Secondary | ICD-10-CM

## 2020-04-02 NOTE — Telephone Encounter (Signed)
Requested  medications are  due for refill today yes  Requested medications are on the active medication list yes  Last refill 6/1  Last visit 12/2018  Future visit scheduled no  Notes to clinic failed protocol due to no visit within 12 months

## 2020-04-09 ENCOUNTER — Other Ambulatory Visit: Payer: Self-pay | Admitting: Obstetrics & Gynecology

## 2020-04-09 DIAGNOSIS — N951 Menopausal and female climacteric states: Secondary | ICD-10-CM

## 2020-04-09 MED ORDER — PREMARIN 0.625 MG/GM VA CREA: TOPICAL_CREAM | VAGINAL | 2 refills | Status: DC

## 2020-04-14 MED ORDER — ESTRADIOL 0.1 MG/GM VA CREA: TOPICAL_CREAM | VAGINAL | 2 refills | Status: AC

## 2020-09-06 ENCOUNTER — Other Ambulatory Visit: Payer: Self-pay | Admitting: Family Medicine

## 2020-09-06 DIAGNOSIS — E785 Hyperlipidemia, unspecified: Secondary | ICD-10-CM

## 2020-09-06 DIAGNOSIS — F3289 Other specified depressive episodes: Secondary | ICD-10-CM

## 2020-09-07 NOTE — Telephone Encounter (Signed)
Requested medication (s) are due for refill today: no  Requested medication (s) are on the active medication list:  yes  Last refill:  09/03/2020  Future visit scheduled: no  Notes to clinic:  overdue for office visit    Requested Prescriptions  Pending Prescriptions Disp Refills   sertraline (ZOLOFT) 100 MG tablet [Pharmacy Med Name: Sertraline HCl 100 MG Oral Tablet] 90 tablet 0    Sig: Take 1 tablet by mouth once daily      Psychiatry:  Antidepressants - SSRI Failed - 09/06/2020  1:23 PM      Failed - Valid encounter within last 6 months    Recent Outpatient Visits           1 year ago Diarrhea of presumed infectious origin   [facility] at [place], Eilleen Kempf, MD   1 year ago Annual physical exam   [facility] at [place], Asencion Partridge, MD   1 year ago    [facility] at [place], Asencion Partridge, MD   1 year ago Screening for hematuria or proteinuria   [facility] [place] [facility], Asencion Partridge, MD   1 year ago Polyarthralgia   [facility] [place] [facility], Asencion Partridge, MD                Passed - Completed PHQ-2 or PHQ-9 in the last 360 days        simvastatin (ZOCOR) 20 MG tablet [Pharmacy Med Name: Simvastatin 20 MG Oral Tablet] 90 tablet 0    Sig: TAKE 1 TABLET BY MOUTH AT BEDTIME      Cardiovascular:  Antilipid - Statins Failed - 09/06/2020  1:23 PM      Failed - Total Cholesterol in normal range and within 360 days    Cholesterol, Total  Date Value Ref Range Status  12/21/2018 279 (H) 100 - 199 mg/dL Final          Failed - LDL in normal range and within 360 days    LDL Calculated  Date Value Ref Range Status  12/21/2018 202 (H) 0 - 99 mg/dL Final   Direct LDL  Date Value Ref Range Status  10/30/2009 172.5 mg/dL Final    Comment:    See lab report for associated comment(s)          Failed - HDL in normal range and within 360 days    HDL  Date Value Ref Range Status  12/21/2018 61 >39 mg/dL Final          Failed - Triglycerides  in normal range and within 360 days    Triglycerides  Date Value Ref Range Status  12/21/2018 81 0 - 149 mg/dL Final          Failed - Valid encounter within last 12 months    Recent Outpatient Visits           1 year ago Diarrhea of presumed infectious origin   [facility] at [place], Eilleen Kempf, MD   1 year ago Annual physical exam   [facility], Asencion Partridge, MD   1 year ago    [facility], Asencion Partridge, MD   1 year ago Screening for hematuria or proteinuria   [facility], Asencion Partridge, MD   1 year ago Polyarthralgia   [facility] at [facility], Asencion Partridge, MD  Passed - Patient is not pregnant

## 2021-01-14 ENCOUNTER — Telehealth: Payer: Self-pay | Admitting: Family Medicine

## 2021-01-14 NOTE — Telephone Encounter (Signed)
Notes reviewed from [facility], [facility] healthy brain study.  CBC, BMP normal, but cholesterol elevated with total 257, triglycerides 99, HDL 49, LDL 191 on 07/16/2020.  A1c was 5.6, normal TSH B12 and insulin levels.  Please schedule for follow-up appointment to discuss medication changes and repeat testing for cholesterol.

## 2021-08-04 ENCOUNTER — Other Ambulatory Visit: Payer: Self-pay | Admitting: Family Medicine

## 2021-08-04 ENCOUNTER — Other Ambulatory Visit: Payer: Self-pay

## 2021-08-04 DIAGNOSIS — Z1231 Encounter for screening mammogram for malignant neoplasm of breast: Secondary | ICD-10-CM

## 2021-09-01 ENCOUNTER — Encounter: Payer: Self-pay | Admitting: Gastroenterology

## 2021-09-07 ENCOUNTER — Ambulatory Visit
Admission: RE | Admit: 2021-09-07 | Discharge: 2021-09-07 | Disposition: A | Discharge status: Home / Self Care | Payer: BC Managed Care – PPO | Source: Ambulatory Visit | Attending: Family Medicine | Admitting: Family Medicine

## 2021-09-07 ENCOUNTER — Other Ambulatory Visit: Payer: Self-pay

## 2021-09-07 DIAGNOSIS — Z1231 Encounter for screening mammogram for malignant neoplasm of breast: Secondary | ICD-10-CM

## 2022-07-03 ENCOUNTER — Emergency Department
Admission: EM | Admit: 2022-07-03 | Discharge: 2022-07-03 | Disposition: A | Discharge status: Home / Self Care | Payer: BC Managed Care – PPO | Attending: Emergency Medicine | Admitting: Emergency Medicine

## 2022-07-03 ENCOUNTER — Encounter: Payer: Self-pay | Admitting: Emergency Medicine

## 2022-07-03 ENCOUNTER — Other Ambulatory Visit: Payer: Self-pay

## 2022-07-03 DIAGNOSIS — R55 Syncope and collapse: Secondary | ICD-10-CM | POA: Diagnosis present

## 2022-07-03 LAB — CBC
HCT: 41 % (ref 36.0–46.0)
Hemoglobin: 13.7 g/dL (ref 12.0–15.0)
MCH: 30.5 pg (ref 26.0–34.0)
MCHC: 33.4 g/dL (ref 30.0–36.0)
MCV: 91.3 fL (ref 80.0–100.0)
Platelets: 237 10*3/uL (ref 150–400)
RBC: 4.49 MIL/uL (ref 3.87–5.11)
RDW: 12.3 % (ref 11.5–15.5)
WBC: 7.4 10*3/uL (ref 4.0–10.5)
nRBC: 0 % (ref 0.0–0.2)

## 2022-07-03 LAB — COMPREHENSIVE METABOLIC PANEL
ALT: 28 U/L (ref 0–44)
AST: 32 U/L (ref 15–41)
Albumin: 4.1 g/dL (ref 3.5–5.0)
Alkaline Phosphatase: 72 U/L (ref 38–126)
Anion gap: 6 (ref 5–15)
BUN: 19 mg/dL (ref 6–20)
CO2: 28 mmol/L (ref 22–32)
Calcium: 9.1 mg/dL (ref 8.9–10.3)
Chloride: 104 mmol/L (ref 98–111)
Creatinine, Ser: 0.87 mg/dL (ref 0.44–1.00)
GFR, Estimated: >60 mL/min (ref >60)
Glucose, Bld: 130 mg/dL — ABNORMAL HIGH (ref 70–99)
Potassium: 3.8 mmol/L (ref 3.5–5.1)
Sodium: 138 mmol/L (ref 135–145)
Total Bilirubin: 0.7 mg/dL (ref 0.3–1.2)
Total Protein: 7.3 g/dL (ref 6.5–8.1)

## 2022-07-03 LAB — TROPONIN I (HIGH SENSITIVITY): Troponin I (High Sensitivity): 2 ng/L (ref <18)

## 2022-07-03 MED ORDER — LACTATED RINGERS IV BOLUS
1000.0000 mL | Freq: Once | INTRAVENOUS | Status: AC
  Administered 2022-07-03: 1000 mL via INTRAVENOUS

## 2022-07-03 MED ORDER — SODIUM CHLORIDE 0.9 % IV BOLUS: 1000.0000 mL | Freq: Once | INTRAVENOUS | Status: DC

## 2022-07-03 NOTE — ED Triage Notes (Signed)
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 Pt now talking in complete sentences no distress noted

## 2022-07-03 NOTE — Discharge Instructions (Signed)
   Thank you for choosing us for your health care today!  Please see your primary doctor this week for a follow up appointment.   If you do not have a primary doctor call the following clinics to establish care:  If you have insurance:  [facility] [phone] [address] [place] [address]   [facility]  [phone] [address] [place] [address]   If you do not have insurance:  [facility]  [phone] [address] [place]  [address]  Sometimes, in the early stages of certain disease courses it is difficult to detect in the emergency department evaluation -- so, it is important that you continue to monitor your symptoms and call your doctor right away or return to the emergency department if you develop any new or worsening symptoms.  It was my pleasure to care for you today.   Lundy Cozart S. Virtie Bungert, MD  

## 2022-07-03 NOTE — ED Provider Notes (Signed)
[facility] Provider Note    Event Date/Time   First MD Initiated Contact with Patient 07/03/22 0037     (approximate)   History   Near Syncope   HPI  Alison Kelley is a 61 y.o. female   Past medical history of [place] depression, febrile seizure, hyperlipidemia who presents to the emergency department today with a presyncopal episode.  She works as a Clinical biochemist in the emergency department was with the patient when she felt lightheaded and felt like she would pass out, was caught and did not receive any injuries, no loss of consciousness or brief loss of consciousness and then felt better.  Assessed at that time with hypotension 60s over 40s.  Rapid improvement.  No seizure activity.  Denies chest pain, shortness of breath, palpitations, or any other symptoms preceding the syncopal episode.  Has otherwise been in her regular state of health with no recent illnesses, normal p.o. intake no vomiting or diarrhea.  She has had similar episodes in the past usually with stress reactions, witnessing medical procedures, etc. that precede syncopal episodes.  History was obtained via the patient.      Physical Exam   Triage Vital Signs: ED Triage Vitals  Enc Vitals Group     BP 07/03/22 0033 (!) 80/60     Pulse Rate 07/03/22 0033 (!) 50     Resp 07/03/22 0033 16     Temp 07/03/22 0033 (!) 97.5 F (36.4 C)     Temp Source 07/03/22 0033 Oral     SpO2 07/03/22 0033 96 %     Weight 07/03/22 0034 145 lb (65.8 kg)     Height 07/03/22 0034 '5\' 7"'$  (1.702 m)     Head Circumference --      Peak Flow --      Pain Score 07/03/22 0034 0     Pain Loc --      Pain Edu? --      Excl. in [place]? --     Most recent vital signs: Vitals:   07/03/22 0036 07/03/22 0100  BP: 112/81 120/71  Pulse:    Resp: 13 15  Temp:    SpO2:      General: Awake, no distress.  CV:  Good peripheral perfusion.  Resp:  Normal effort.  Lungs clear. Abd:  No distention.  Other:  No focal  deficits, alert and oriented.  Motor and sensory exam normal no facial deficits and no visual deficits or field cuts.  Blood pressure rapidly improved with no intervention   ED Results / Procedures / Treatments   Labs (all labs ordered are listed, but only abnormal results are displayed) Labs Reviewed  COMPREHENSIVE METABOLIC PANEL - Abnormal; Notable for the following components:      Result Value   Glucose, Bld 130 (*)    All other components within normal limits  CBC  CBG MONITORING, ED  CBG MONITORING, ED  TROPONIN I (HIGH SENSITIVITY)  TROPONIN I (HIGH SENSITIVITY)     I reviewed labs and they are notable for troponin wnl and Cr wnl  EKG  ED ECG REPORT I, Lucillie Garfinkel, the attending physician, personally viewed and interpreted this ECG.   Date: 07/03/2022  EKG Time: 0036  Rate: 52  Rhythm: sinus bradycardia  Axis: nl  Intervals:none  ST&T Change: no ischemic changes    PROCEDURES:  Critical Care performed: No  Procedures   MEDICATIONS ORDERED IN ED: Medications  lactated ringers bolus 1,000 mL (1,000 mLs  Intravenous New Bag/Given 07/03/22 0042)   IMPRESSION / MDM / ASSESSMENT AND PLAN / ED COURSE  I reviewed the triage vital signs and the nursing notes.                              Differential diagnosis includes, but is not limited to, vasovagal syncope, orthostatic syncope, dehydration, cardiac etiology including arrhythmia, ACS less likely.   The patient is on the cardiac monitor to evaluate for evidence of arrhythmia and/or significant heart rate changes.  MDM: Patient with history of vasovagal like syncopal episodes and a similar one today.  Rapid resolution no focal neurological deficits and no preceding chest pain or shortness of breath to suggest more sinister etiologies today.  She rapidly improved without intervention and also received 1 L of crystalloid and remained stable in the emergency department without complaints.  Electrolytes within  normal limits.  Initial troponin negative.  Safe for discharge home with close PMD follow-up and return precautions.  Patient's presentation is most consistent with acute presentation with potential threat to life or bodily function.       FINAL CLINICAL IMPRESSION(S) / ED DIAGNOSES   Final diagnoses:  Near syncope     Rx / DC Orders   ED Discharge Orders     None        Note:  This document was prepared using Dragon voice recognition software and may include unintentional dictation errors.    Lucillie Garfinkel, MD 07/03/22 0230

## 2023-03-08 ENCOUNTER — Encounter: Payer: Self-pay | Admitting: Gastroenterology
